# Patient Record
Sex: Female | Born: 1970 | Race: White | Hispanic: No | Marital: Married | Smoking: Never smoker
Health system: Southern US, Community
[De-identification: ages and names within clinical notes are randomized; demographics above are authoritative.]

## PROBLEM LIST (undated history)

## (undated) DIAGNOSIS — G43909 Migraine, unspecified, not intractable, without status migrainosus: Secondary | ICD-10-CM

## (undated) DIAGNOSIS — N200 Calculus of kidney: Secondary | ICD-10-CM

## (undated) DIAGNOSIS — N809 Endometriosis, unspecified: Secondary | ICD-10-CM

## (undated) HISTORY — DX: Endometriosis, unspecified: N80.9

## (undated) HISTORY — DX: Migraine, unspecified, not intractable, without status migrainosus: G43.909

## (undated) HISTORY — DX: Calculus of kidney: N20.0

## (undated) HISTORY — PX: LAPAROSCOPIC VAGINAL HYSTERECTOMY: SUR798

---

## 2016-06-22 ENCOUNTER — Ambulatory Visit (INDEPENDENT_AMBULATORY_CARE_PROVIDER_SITE_OTHER): Payer: 59 | Admitting: Neurology

## 2016-06-22 ENCOUNTER — Other Ambulatory Visit: Payer: 59

## 2016-06-22 ENCOUNTER — Encounter: Payer: Self-pay | Admitting: Neurology

## 2016-06-22 VITALS — BP 116/72 | HR 78 | Ht 65.0 in | Wt 126.0 lb

## 2016-06-22 DIAGNOSIS — G44229 Chronic tension-type headache, not intractable: Secondary | ICD-10-CM

## 2016-06-22 DIAGNOSIS — G43709 Chronic migraine without aura, not intractable, without status migrainosus: Secondary | ICD-10-CM | POA: Diagnosis not present

## 2016-06-22 DIAGNOSIS — IMO0002 Reserved for concepts with insufficient information to code with codable children: Secondary | ICD-10-CM

## 2016-06-22 LAB — COMPLETE METABOLIC PANEL WITH GFR
ALT: 20 U/L (ref 6–29)
AST: 19 U/L (ref 10–35)
Albumin: 4.6 g/dL (ref 3.6–5.1)
Alkaline Phosphatase: 50 U/L (ref 33–115)
BILIRUBIN TOTAL: 0.3 mg/dL (ref 0.2–1.2)
BUN: 15 mg/dL (ref 7–25)
CALCIUM: 9.8 mg/dL (ref 8.6–10.2)
CHLORIDE: 106 mmol/L (ref 98–110)
CO2: 27 mmol/L (ref 20–31)
Creat: 0.84 mg/dL (ref 0.50–1.10)
GFR, EST NON AFRICAN AMERICAN: 83 mL/min (ref >=60)
Glucose, Bld: 92 mg/dL (ref 65–99)
Potassium: 4.7 mmol/L (ref 3.5–5.3)
Sodium: 139 mmol/L (ref 135–146)
Total Protein: 7.3 g/dL (ref 6.1–8.1)

## 2016-06-22 MED ORDER — SUMATRIPTAN SUCCINATE 3 MG/0.5ML ~~LOC~~ SOAJ: 1.0000 | SUBCUTANEOUS | 0 refills | Status: DC

## 2016-06-22 NOTE — Addendum Note (Signed)
Addended bySilvio Pate on: 06/22/2016 04:13 PM   Modules accepted: Orders

## 2016-06-22 NOTE — Progress Notes (Signed)
NEUROLOGY CONSULTATION NOTE  Brenda Macias MRN: [id number] DOB: November 04, 1970  Referring provider: Dr. Maryjean Ka Primary care provider: No PCP  Reason for consult:  migraines  HISTORY OF PRESENT ILLNESS: Brenda Macias is a 46 year old female who presents for migraines.    Onset:  childhood Location:  Frontal or unilateral either side Quality:  stabbing Intensity:  10/10 Aura:  Flashing blue lights and floaters in vision Prodrome:  Fatigue, hunger, irritability Postdrome:  fatigue Associated symptoms:  Photophobia, phonophobia, osmophobia.  Rarely nausea.  She has not had any new worse headache of her life, waking up from sleep Duration:  1 to 3 days (no more than 2 hours with Zomig except for when she wakes up with migraine, in which they last longer) Frequency: Prior to Botox, almost daily.  Since Botox, 1 migraine day a month and total of 4 or 5 headache days a month. Triggers/exacerbating factors:  Cigarette smoke, perfumes, cheese, citrus, sleep deprivation, too much sleep, menstrual cycle, change in barometric pressure. Relieving factors:  Water, sleep, rest Activity:  Aggravates She also has bi-temporal non-throbbing tension type headaches.  They have been daily since missing her last Botox in March ([place]  Past NSAIDS:  ibuprofen, naproxen Past analgesics:  acetaminophen, Excedrin Past abortive triptans:  Zecuity patch, Maxalt (ineffective), Relpax (side effects) Past muscle relaxants:  Robaxin Past anti-emetic:  no Past antihypertensive medications:  No.  Propranolol not good option due to hypotension. Past antidepressant medications:  Cannot recall Past anticonvulsant medications:  none Other past therapies:  no  Current NSAIDS:  diclofenac ER  daily PRN Current analgesics:  Fioricet (only for severe tension headaches) Current triptans:  Zomig ZMT , Sumavel DosePro  [place] Current anti-emetic:  no Current muscle relaxants:  no Current  anti-anxiolytic:  no Current sleep aide:  no Current Antihypertensive medications:  no Current Antidepressant medications:  no Current Anticonvulsant medications:  Trokendi XR  (  causes drowsiness) Current Vitamins/Herbal/Supplements:  B12 Current Antihistamines/Decongestants:  no Other therapy:  Botox  Caffeine:  1 coke/day, green tea Alcohol:  no Smoker:  no Diet:  Hydrates.  Follows strict diet Exercise:  yes Depression/anxiety:  no Sleep hygiene:  good Family history of headache:  Mother, brother  MRI of brain without contrast from 11/02/13 was personally reviewed and demonstrated minimal and subtle foci in the left periventricular and parietal subcortical white matter.  A follow up MRI of brain with contrast was performed on 11/13/13, which revealed no abnormal enhancement.  PAST MEDICAL HISTORY: Past Medical History:  Diagnosis Date  . Endometriosis   . Kidney stones   . Migraines    pt states onset around the age of 60    PAST SURGICAL HISTORY: Past Surgical History:  Procedure Laterality Date  . LAPAROSCOPIC VAGINAL HYSTERECTOMY      MEDICATIONS: No current outpatient prescriptions on file prior to visit.   No current facility-administered medications on file prior to visit.     ALLERGIES: Allergies not on file  FAMILY HISTORY: Family History  Problem Relation Age of Onset  . Endometriosis Mother   . Hypertension Father   . Kidney cancer Father     SOCIAL HISTORY: Social History   Social History  . Marital status: Married    Spouse name: N/A  . Number of children: N/A  . Years of education: N/A   Occupational History  . Not on file.   Social History Main Topics  . Smoking status: Never Smoker  . Smokeless tobacco:  Never Used  . Alcohol use No  . Drug use: No  . Sexual activity: Yes    Birth control/ protection: Surgical   Other Topics Concern  . Not on file   Social History Narrative  . No narrative on file    REVIEW OF  SYSTEMS: Constitutional: No fevers, chills, or sweats, no generalized fatigue, change in appetite Eyes: No visual changes, double vision, eye pain Ear, nose and throat: No hearing loss, ear pain, nasal congestion, sore throat Cardiovascular: No chest pain, palpitations Respiratory:  No shortness of breath at rest or with exertion, wheezes GastrointestinaI: No nausea, vomiting, diarrhea, abdominal pain, fecal incontinence Genitourinary:  No dysuria, urinary retention or frequency Musculoskeletal:  No neck pain, back pain Integumentary: No rash, pruritus, skin lesions Neurological: as above Psychiatric: No depression, insomnia, anxiety Endocrine: No palpitations, fatigue, diaphoresis, mood swings, change in appetite, change in weight, increased thirst Hematologic/Lymphatic:  No purpura, petechiae. Allergic/Immunologic: no itchy/runny eyes, nasal congestion, recent allergic reactions, rashes  PHYSICAL EXAM: Vitals:   06/22/16 1456  BP: 116/72  Pulse: 78   General: No acute distress.  Patient appears well-groomed.  Head:  Normocephalic/atraumatic Eyes:  fundi examined but not visualized Neck: supple, no paraspinal tenderness, full range of motion Back: No paraspinal tenderness Heart: regular rate and rhythm Lungs: Clear to auscultation bilaterally. Vascular: No carotid bruits. Neurological Exam: Mental status: alert and oriented to person, place, and time, recent and remote memory intact, fund of knowledge intact, attention and concentration intact, speech fluent and not dysarthric, language intact. Cranial nerves: CN I: not tested CN II: pupils equal, round and reactive to light, visual fields intact CN III, IV, VI:  full range of motion, no nystagmus, no ptosis CN V: facial sensation intact CN VII: upper and lower face symmetric CN VIII: hearing intact CN IX, X: gag intact, uvula midline CN XI: sternocleidomastoid and trapezius muscles intact CN XII: tongue midline Bulk &  Tone: normal, no fasciculations. Motor:  5/5 throughout  Sensation: temperature and vibration sensation intact. Deep Tendon Reflexes:  2+ throughout, toes downgoing.  Finger to nose testing:  Without dysmetria.  Heel to shin:  Without dysmetria.  Gait:  Normal station and stride.  Able to turn and tandem walk. Romberg negative.  IMPRESSION: Chronic migraine/migraine with aura Chronic tension type headache  PLAN: 1.  She has responded well to Botox (well over 50% reduction in headache frequency per month after starting Botox), so we will get pre-authorization and restart it ASAP. 2.  Continue Trokendi XR  at bedtime 3.  Zomig  tablet for abortive therapy.  Will try Zembrace SymTouch injection when she wakes up with migraine. 4. Limit use of all pain relievers (diclofenac, Zomig, acetaminophen) to no more than 2 days out of the week to prevent rebound headache.  Use Fioricet sparingly. 5.  Continue exercise, diet, hydration, sleep hygiene 6.  Follow up for Botox  Thank you for allowing me to take part in the care of this patient.  Shon Millet, DO  CC:  Jossie Ng, MD

## 2016-06-22 NOTE — Progress Notes (Signed)
Note sent to Dr. Maryjean Ka.

## 2016-06-22 NOTE — Patient Instructions (Signed)
1.  We will get preauthorization and set you up for Botox ASAP 2.  Continue Trokendi XR  at bedtime 3.  Stop taking the diclofenac and acetaminophen daily.  Limit use of pain relievers to no more than 2 days out of the week to prevent rebound headache (this includes the Zomig, Fioricet, diclofenac, ibuprofen, Advil, acetaminophen).  Try to use Fiorcet sparingly. 4.  Use Zomig  tablet earliest onset of migraine.  May repeat dose once after 2 hours as needed.  If you wake up with migraine, use the sumatriptan (Zembrace SymTouch) injection (inject once and may repeat once in 1 hour if needed). 4. Be aware of common food triggers such as processed sweets, processed foods with nitrites (such as deli meat, hot dogs, sausages), foods with MSG, alcohol (such as wine), chocolate, certain cheeses, certain fruits (dried fruits, some citrus fruit), vinegar, diet soda. 5.  Avoid caffeine 6.  Routine exercise 7.  Proper sleep hygiene 8.  Stay adequately hydrated with water 9.  Keep a headache diary. 1.  Maintain proper stress management. 10.  Do not skip meals. 11.  Consider supplements:  Magnesium citrate  to  daily, riboflavin , Coenzyme Q 10  three times daily 12.  Follow up for Botox

## 2016-06-26 DIAGNOSIS — Z8669 Personal history of other diseases of the nervous system and sense organs: Secondary | ICD-10-CM | POA: Insufficient documentation

## 2016-06-26 LAB — HM MAMMOGRAPHY: HM MAMMO: NORMAL (ref 0–4)

## 2016-06-27 DIAGNOSIS — Z780 Asymptomatic menopausal state: Secondary | ICD-10-CM | POA: Insufficient documentation

## 2016-07-04 ENCOUNTER — Telehealth: Payer: Self-pay | Admitting: Neurology

## 2016-07-04 NOTE — Telephone Encounter (Signed)
Caller: PT  Urgent? No  Reason for the call: PT left a voicemail message regarding her Migraines

## 2016-07-04 NOTE — Telephone Encounter (Signed)
Spoke with patient regarding her migraines.  She says her arms and legs got numb and tingling this last migraine.  She is feeling better today.  She felt like she couldn't move and her body was heavy.  Advised her this can be a side effect of the migraine.  She just wanted to be sure there was nothing to be concerned about.  Advised patient that if this happens again and doesn't stop to go to urgent care or ER.  Patient understands.

## 2016-07-06 ENCOUNTER — Telehealth: Payer: Self-pay | Admitting: Neurology

## 2016-07-06 NOTE — Telephone Encounter (Signed)
PT called and wants to know if she can come in next week and pay for the Botox injections out of pocket while waiting for approval because the headaches are really bad/Dawn

## 2016-07-09 NOTE — Telephone Encounter (Signed)
Due to contract with insurance, we are not able to do that.

## 2016-07-11 NOTE — Telephone Encounter (Signed)
What we can do is schedule her in a follow up slot and try to get the pre-authorization approved in time for the scheduled Botox.

## 2016-07-11 NOTE — Telephone Encounter (Signed)
It is typically scheduled for next Botox clinic and we work on getting pre-authorization until then.  We can schedule her earlier for a regular follow up slot.  However, we would need to get pre-authorization first.

## 2016-07-11 NOTE — Telephone Encounter (Signed)
Patient wants to know when she can come in and get botox injection please advise patient

## 2016-07-27 ENCOUNTER — Encounter: Payer: Self-pay | Admitting: Neurology

## 2016-08-02 ENCOUNTER — Encounter: Payer: Self-pay | Admitting: Neurology

## 2016-08-13 DIAGNOSIS — Z8742 Personal history of other diseases of the female genital tract: Secondary | ICD-10-CM | POA: Insufficient documentation

## 2016-08-17 ENCOUNTER — Ambulatory Visit (INDEPENDENT_AMBULATORY_CARE_PROVIDER_SITE_OTHER): Payer: 59 | Admitting: Neurology

## 2016-08-17 DIAGNOSIS — G43709 Chronic migraine without aura, not intractable, without status migrainosus: Secondary | ICD-10-CM

## 2016-08-17 DIAGNOSIS — IMO0002 Reserved for concepts with insufficient information to code with codable children: Secondary | ICD-10-CM

## 2016-08-17 MED ORDER — ONABOTULINUMTOXINA 100 UNITS IJ SOLR
155.0000 [IU] | Freq: Once | INTRAMUSCULAR | Status: AC
  Administered 2016-08-17: 155 [IU] via INTRAMUSCULAR

## 2016-08-17 MED ORDER — ONABOTULINUMTOXINA 100 UNITS IJ SOLR: 155.0000 [IU] | Freq: Once | INTRAMUSCULAR | Status: DC

## 2016-08-17 NOTE — Progress Notes (Signed)
[facility]   Procedure Note Botox  Attending: Dr. Everlena CooperJaffe  Preoperative Diagnosis(es): Chronic migraine  Consent obtained from: The patient Benefits discussed included, but were not limited to decreased muscle tightness, increased joint range of motion, and decreased pain.  Risk discussed included, but were not limited pain and discomfort, bleeding, bruising, excessive weakness, venous thrombosis, muscle atrophy and dysphagia.  Anticipated outcomes of the procedure as well as he risks and benefits of the alternatives to the procedure, and the roles and tasks of the personnel to be involved, were discussed with the patient, and the patient consents to the procedure and agrees to proceed. A copy of the patient medication guide was given to the patient which explains the blackbox warning.  Patients identity and treatment sites confirmed Yes.  .  Details of Procedure: Skin was cleaned with alcohol. Prior to injection, the needle plunger was aspirated to make sure the needle was not within a blood vessel.  There was no blood retrieved on aspiration.    Following is a summary of the muscles injected  And the amount of Botulinum toxin used:  Dilution 200 units of Botox was reconstituted with 4 ml of preservative free normal saline. Time of reconstitution: At the time of the office visit (<30 minutes prior to injection)   Injections  155 total units of Botox was injected with a 30 gauge needle.  Injection Sites: L occipitalis: 15 units- 3 sites  R occiptalis: 15 units- 3 sites  L upper trapezius: 15 units- 3 sites R upper trapezius: 15 units- 3 sits          L paraspinal: 10 units- 2 sites R paraspinal: 10 units- 2 sites  Face L frontalis(2 injection sites):10 units   R frontalis(2 injection sites):10 units         L corrugator: 5 units   R corrugator: 5 units           Procerus: 5 units   L temporalis: 20 units R temporalis: 20 units   Agent:  200 units of botulinum Type A  (Onobotulinum Toxin type A) was reconstituted with 4 ml of preservative free normal saline.  Time of reconstitution: At the time of the office visit (<30 minutes prior to injection)     Total injected (Units): 155  Total wasted (Units): 45  Patient tolerated procedure well without complications.   Reinjection is anticipated in 3 months. Return to clinic in 4 1/2 months.  Shon MilletAdam Varnell Donate, DO

## 2016-10-22 ENCOUNTER — Encounter: Payer: Self-pay | Admitting: Physician Assistant

## 2016-10-22 ENCOUNTER — Ambulatory Visit (INDEPENDENT_AMBULATORY_CARE_PROVIDER_SITE_OTHER): Payer: 59 | Admitting: Physician Assistant

## 2016-10-22 VITALS — BP 100/72 | HR 77 | Temp 98.4°F | Resp 14 | Ht 65.5 in | Wt 127.0 lb

## 2016-10-22 DIAGNOSIS — Z8669 Personal history of other diseases of the nervous system and sense organs: Secondary | ICD-10-CM

## 2016-10-22 DIAGNOSIS — Z0000 Encounter for general adult medical examination without abnormal findings: Secondary | ICD-10-CM | POA: Insufficient documentation

## 2016-10-22 DIAGNOSIS — R5383 Other fatigue: Secondary | ICD-10-CM | POA: Diagnosis not present

## 2016-10-22 LAB — URINALYSIS, ROUTINE W REFLEX MICROSCOPIC
BILIRUBIN URINE: NEGATIVE
KETONES UR: NEGATIVE
NITRITE: NEGATIVE
RBC / HPF: NONE SEEN (ref 0–?)
Specific Gravity, Urine: <=1.005 — AB (ref 1.000–1.030)
TOTAL PROTEIN, URINE-UPE24: NEGATIVE
Urine Glucose: NEGATIVE
Urobilinogen, UA: 0.2 (ref 0.0–1.0)
pH: 6.5 (ref 5.0–8.0)

## 2016-10-22 LAB — CBC WITH DIFFERENTIAL/PLATELET
BASOS ABS: 0.1 10*3/uL (ref 0.0–0.1)
Basophils Relative: 1.1 % (ref 0.0–3.0)
EOS PCT: 3.9 % (ref 0.0–5.0)
Eosinophils Absolute: 0.2 10*3/uL (ref 0.0–0.7)
HEMATOCRIT: 38.8 % (ref 36.0–46.0)
HEMOGLOBIN: 13.5 g/dL (ref 12.0–15.0)
LYMPHS ABS: 1.2 10*3/uL (ref 0.7–4.0)
LYMPHS PCT: 24.1 % (ref 12.0–46.0)
MCHC: 34.7 g/dL (ref 30.0–36.0)
MCV: 88.2 fl (ref 78.0–100.0)
MONOS PCT: 5 % (ref 3.0–12.0)
Monocytes Absolute: 0.3 10*3/uL (ref 0.1–1.0)
NEUTROS PCT: 65.9 % (ref 43.0–77.0)
Neutro Abs: 3.4 10*3/uL (ref 1.4–7.7)
Platelets: 200 10*3/uL (ref 150.0–400.0)
RBC: 4.4 Mil/uL (ref 3.87–5.11)
RDW: 12.8 % (ref 11.5–15.5)
WBC: 5.1 10*3/uL (ref 4.0–10.5)

## 2016-10-22 LAB — LIPID PANEL
CHOLESTEROL: 109 mg/dL (ref 0–200)
HDL: 41.7 mg/dL (ref >39.00)
LDL Cholesterol: 39 mg/dL (ref 0–99)
NonHDL: 67.62
Total CHOL/HDL Ratio: 3
Triglycerides: 145 mg/dL (ref 0.0–149.0)
VLDL: 29 mg/dL (ref 0.0–40.0)

## 2016-10-22 LAB — COMPREHENSIVE METABOLIC PANEL
ALBUMIN: 4.5 g/dL (ref 3.5–5.2)
ALK PHOS: 42 U/L (ref 39–117)
ALT: 20 U/L (ref 0–35)
AST: 18 U/L (ref 0–37)
BILIRUBIN TOTAL: 0.6 mg/dL (ref 0.2–1.2)
BUN: 11 mg/dL (ref 6–23)
CO2: 27 mEq/L (ref 19–32)
Calcium: 9.3 mg/dL (ref 8.4–10.5)
Chloride: 105 mEq/L (ref 96–112)
Creatinine, Ser: 0.75 mg/dL (ref 0.40–1.20)
GFR: 87.71 mL/min (ref >60.00)
GLUCOSE: 93 mg/dL (ref 70–99)
Potassium: 4.1 mEq/L (ref 3.5–5.1)
Sodium: 139 mEq/L (ref 135–145)
TOTAL PROTEIN: 7.5 g/dL (ref 6.0–8.3)

## 2016-10-22 LAB — VITAMIN D 25 HYDROXY (VIT D DEFICIENCY, FRACTURES): VITD: 38.25 ng/mL (ref 30.00–100.00)

## 2016-10-22 LAB — HEMOGLOBIN A1C: Hgb A1c MFr Bld: 5 % (ref 4.6–6.5)

## 2016-10-22 LAB — TSH: TSH: 0.89 u[IU]/mL (ref 0.35–4.50)

## 2016-10-22 LAB — VITAMIN B12: VITAMIN B 12: 1012 pg/mL — AB (ref 211–911)

## 2016-10-22 NOTE — Progress Notes (Signed)
Patient presents to clinic today to establish care. Patient is also requesting CPE today. Is fasting for labs.   Acute Concerns: Endorses fatigue over the past few months since move from [place]. Feels this can be multifactorial. Endorses sleeping 9 hours per night but feels sleep is unrestful. Denies known tossing or turning at night. Denies snoring or witness apneic episodes. Denies change in appetite. Has kept consistent diet over the past few years but has noted 10 pound weight gain over the past few months. Denies depressed mood or anhedonia. Denies anxiety. Denies constipation. Notes dryness of skin. Father diagnosed with thyroid carcinoma at age 101.   Chronic Issues: Chronic Migraines -- Followed by Neurology -- Dr. Everlena Cooper. Is currently on a regimen of Botox injections, Trokendi XR and Zomig. Endorses migraine headache about once per week at present. Rare use of Butalbital. Has follow-up scheduled.   Health Maintenance: Immunizations -- Tetanus due. Patient declines.  Mammogram -- 2018. Dense breast tissue. Repeat yearly.  PAP -- 2017. Normal per patient. Will get records.   Past Medical History:  Diagnosis Date  . Endometriosis   . Kidney stones    Cyst and Kidney Stones  . Migraines    pt states onset around the age of 58    Past Surgical History:  Procedure Laterality Date  . LAPAROSCOPIC VAGINAL HYSTERECTOMY      Current Outpatient Prescriptions on File Prior to Visit  Medication Sig Dispense Refill  . diclofenac (VOLTAREN) 25 MG EC tablet Take 25 mg by mouth daily. Pt unsure of dosage.     Marland Kitchen NASCOBAL 500 MCG/0.1ML SOLN USE ONE SPRAY ONCE WEEKLY  11  . TROKENDI XR 25 MG CP24 Take 1 capsule by mouth daily.  3  . VIVELLE-DOT 0.1 MG/24HR patch APPLY 1 PATCH TWICE WEEKLY  0  . zolmitriptan (ZOMIG) 5 MG tablet Take 5 mg by mouth as needed for migraine.     No current facility-administered medications on file prior to visit.     Allergies  Allergen Reactions  .  Penicillins Rash    rash  . Rocephin [Ceftriaxone] Rash    Rash    Family History  Problem Relation Age of Onset  . Endometriosis Mother   . Hypertension Father   . Kidney cancer Father   . Cancer Father        Hx Thyroid  . Healthy Brother   . Healthy Brother     Social History   Social History  . Marital status: Married    Spouse name: N/A  . Number of children: N/A  . Years of education: N/A   Occupational History  . Not on file.   Social History Main Topics  . Smoking status: Never Smoker  . Smokeless tobacco: Never Used  . Alcohol use No  . Drug use: No  . Sexual activity: Yes    Birth control/ protection: Surgical     Comment: Hysterectomy   Other Topics Concern  . Not on file   Social History Narrative  . No narrative on file   Review of Systems  Constitutional: Positive for malaise/fatigue. Negative for fever and weight loss.  HENT: Negative for ear discharge, ear pain, hearing loss and tinnitus.   Eyes: Negative for blurred vision, double vision, photophobia and pain.  Respiratory: Negative for cough and shortness of breath.   Cardiovascular: Negative for chest pain and palpitations.  Gastrointestinal: Negative for abdominal pain, blood in stool, constipation, diarrhea, heartburn, melena, nausea and vomiting.  Genitourinary:  Negative for dysuria, flank pain, frequency, hematuria and urgency.  Musculoskeletal: Negative for falls.  Neurological: Positive for headaches. Negative for dizziness, tingling, tremors, sensory change, speech change, focal weakness, seizures and loss of consciousness.  Endo/Heme/Allergies: Negative for environmental allergies.  Psychiatric/Behavioral: Negative for depression, hallucinations, substance abuse and suicidal ideas. The patient is not nervous/anxious and does not have insomnia.     BP 100/72   Pulse 77   Temp 98.4 F (36.9 C) (Oral)   Resp 14   Ht 5' 5.5" (1.664 m)   Wt 127 lb (57.6 kg)   SpO2 99%   BMI 20.81  kg/m   Physical Exam  Constitutional: She is oriented to person, place, and time and well-developed, well-nourished, and in no distress.  HENT:  Head: Normocephalic and atraumatic.  Right Ear: Tympanic membrane, external ear and ear canal normal.  Left Ear: Tympanic membrane, external ear and ear canal normal.  Nose: Nose normal. No mucosal edema.  Mouth/Throat: Uvula is midline, oropharynx is clear and moist and mucous membranes are normal. No oropharyngeal exudate or posterior oropharyngeal erythema.  Eyes: Pupils are equal, round, and reactive to light. Conjunctivae are normal.  Neck: Neck supple. No thyromegaly present.  Cardiovascular: Normal rate, regular rhythm, normal heart sounds and intact distal pulses.   Pulmonary/Chest: Effort normal and breath sounds normal. No respiratory distress. She has no wheezes. She has no rales.  Abdominal: Soft. Bowel sounds are normal. She exhibits no distension and no mass. There is no tenderness. There is no rebound and no guarding.  Lymphadenopathy:    She has no cervical adenopathy.  Neurological: She is alert and oriented to person, place, and time. No cranial nerve deficit.  Skin: Skin is warm and dry. No rash noted.  Psychiatric: Affect normal.  Vitals reviewed.  Assessment/Plan: Visit for preventive health examination Depression screen negative. Health Maintenance reviewed. Will get records. Declines Tetanus today. Preventive schedule discussed and handout given in AVS. Will obtain fasting labs today.   Other fatigue Unclear. Denies depression or anxiety. Will check lab workup today to include TSH, B12 and Vitamin D.   History of migraine Followed by Neurology. Continue care as directed.     Piedad Climes, PA-C

## 2016-10-22 NOTE — Assessment & Plan Note (Signed)
Unclear. Denies depression or anxiety. Will check lab workup today to include TSH, B12 and Vitamin D.

## 2016-10-22 NOTE — Assessment & Plan Note (Signed)
Depression screen negative. Health Maintenance reviewed. Will get records. Declines Tetanus today. Preventive schedule discussed and handout given in AVS. Will obtain fasting labs today.

## 2016-10-22 NOTE — Assessment & Plan Note (Signed)
Followed by Neurology. Continue care as directed.

## 2016-10-22 NOTE — Progress Notes (Signed)
Pre visit review using our clinic review tool, if applicable. No additional management support is needed unless otherwise documented below in the visit note. 

## 2016-10-22 NOTE — Patient Instructions (Signed)
Please go to the lab for blood work.   Our office will call you with your results unless you have chosen to receive results via MyChart.  If your blood work is normal we will follow-up each year for physicals and as scheduled for chronic medical problems.  If anything is abnormal we will treat accordingly and get you in for a follow-up.  Please follow-up with specialists as scheduled.   Preventive Care 40-64 Years, Female Preventive care refers to lifestyle choices and visits with your health care provider that can promote health and wellness. What does preventive care include?  A yearly physical exam. This is also called an annual well check.  Dental exams once or twice a year.  Routine eye exams. Ask your health care provider how often you should have your eyes checked.  Personal lifestyle choices, including: ? Daily care of your teeth and gums. ? Regular physical activity. ? Eating a healthy diet. ? Avoiding tobacco and drug use. ? Limiting alcohol use. ? Practicing safe sex. ? Taking low-dose aspirin daily starting at age 73. ? Taking vitamin and mineral supplements as recommended by your health care provider. What happens during an annual well check? The services and screenings done by your health care provider during your annual well check will depend on your age, overall health, lifestyle risk factors, and family history of disease. Counseling Your health care provider may ask you questions about your:  Alcohol use.  Tobacco use.  Drug use.  Emotional well-being.  Home and relationship well-being.  Sexual activity.  Eating habits.  Work and work Statistician.  Method of birth control.  Menstrual cycle.  Pregnancy history.  Screening You may have the following tests or measurements:  Height, weight, and BMI.  Blood pressure.  Lipid and cholesterol levels. These may be checked every 5 years, or more frequently if you are over 63 years old.  Skin  check.  Lung cancer screening. You may have this screening every year starting at age 22 if you have a 30-pack-year history of smoking and currently smoke or have quit within the past 15 years.  Fecal occult blood test (FOBT) of the stool. You may have this test every year starting at age 67.  Flexible sigmoidoscopy or colonoscopy. You may have a sigmoidoscopy every 5 years or a colonoscopy every 10 years starting at age 20.  Hepatitis C blood test.  Hepatitis B blood test.  Sexually transmitted disease (STD) testing.  Diabetes screening. This is done by checking your blood sugar (glucose) after you have not eaten for a while (fasting). You may have this done every 1-3 years.  Mammogram. This may be done every 1-2 years. Talk to your health care provider about when you should start having regular mammograms. This may depend on whether you have a family history of breast cancer.  BRCA-related cancer screening. This may be done if you have a family history of breast, ovarian, tubal, or peritoneal cancers.  Pelvic exam and Pap test. This may be done every 3 years starting at age 52. Starting at age 78, this may be done every 5 years if you have a Pap test in combination with an HPV test.  Bone density scan. This is done to screen for osteoporosis. You may have this scan if you are at high risk for osteoporosis.  Discuss your test results, treatment options, and if necessary, the need for more tests with your health care provider. Vaccines Your health care provider may recommend certain  vaccines, such as:  Influenza vaccine. This is recommended every year.  Tetanus, diphtheria, and acellular pertussis (Tdap, Td) vaccine. You may need a Td booster every 10 years.  Varicella vaccine. You may need this if you have not been vaccinated.  Zoster vaccine. You may need this after age 57.  Measles, mumps, and rubella (MMR) vaccine. You may need at least one dose of MMR if you were born in 1957  or later. You may also need a second dose.  Pneumococcal 13-valent conjugate (PCV13) vaccine. You may need this if you have certain conditions and were not previously vaccinated.  Pneumococcal polysaccharide (PPSV23) vaccine. You may need one or two doses if you smoke cigarettes or if you have certain conditions.  Meningococcal vaccine. You may need this if you have certain conditions.  Hepatitis A vaccine. You may need this if you have certain conditions or if you travel or work in places where you may be exposed to hepatitis A.  Hepatitis B vaccine. You may need this if you have certain conditions or if you travel or work in places where you may be exposed to hepatitis B.  Haemophilus influenzae type b (Hib) vaccine. You may need this if you have certain conditions.  Talk to your health care provider about which screenings and vaccines you need and how often you need them. This information is not intended to replace advice given to you by your health care provider. Make sure you discuss any questions you have with your health care provider. Document Released: 03/18/2015 Document Revised: 11/09/2015 Document Reviewed: 12/21/2014 Elsevier Interactive Patient Education  2017 [employer].

## 2016-10-24 ENCOUNTER — Other Ambulatory Visit: Payer: Self-pay | Admitting: Physician Assistant

## 2016-10-24 ENCOUNTER — Encounter: Payer: Self-pay | Admitting: Emergency Medicine

## 2016-10-24 MED ORDER — TINIDAZOLE 500 MG PO TABS: 1.0000 g | ORAL_TABLET | Freq: Every day | ORAL | 0 refills | Status: DC

## 2016-11-23 ENCOUNTER — Ambulatory Visit (INDEPENDENT_AMBULATORY_CARE_PROVIDER_SITE_OTHER): Payer: 59 | Admitting: Neurology

## 2016-11-23 DIAGNOSIS — G43709 Chronic migraine without aura, not intractable, without status migrainosus: Secondary | ICD-10-CM

## 2016-11-23 DIAGNOSIS — IMO0002 Reserved for concepts with insufficient information to code with codable children: Secondary | ICD-10-CM

## 2016-11-23 MED ORDER — ONABOTULINUMTOXINA 100 UNITS IJ SOLR
200.0000 [IU] | Freq: Once | INTRAMUSCULAR | Status: AC
  Administered 2016-11-23: 200 [IU] via INTRAMUSCULAR

## 2016-11-23 MED ORDER — ONABOTULINUMTOXINA 100 UNITS IJ SOLR: 155.0000 [IU] | Freq: Once | INTRAMUSCULAR | Status: DC

## 2016-11-23 NOTE — Progress Notes (Signed)
[facility]   Procedure Note Botox  Attending: Dr. Jaffe  Preoperative Diagnosis(es): Chronic migraine  Consent obtained from: The patient Benefits discussed included, but were not limited to decreased muscle tightness, increased joint range of motion, and decreased pain.  Risk discussed included, but were not limited pain and discomfort, bleeding, bruising, excessive weakness, venous thrombosis, muscle atrophy and dysphagia.  Anticipated outcomes of the procedure as well as he risks and benefits of the alternatives to the procedure, and the roles and tasks of the personnel to be involved, were discussed with the patient, and the patient consents to the procedure and agrees to proceed. A copy of the patient medication guide was given to the patient which explains the blackbox warning.  Patients identity and treatment sites confirmed Yes.  .  Details of Procedure: Skin was cleaned with alcohol. Prior to injection, the needle plunger was aspirated to make sure the needle was not within a blood vessel.  There was no blood retrieved on aspiration.    Following is a summary of the muscles injected  And the amount of Botulinum toxin used:  Dilution 200 units of Botox was reconstituted with 4 ml of preservative free normal saline. Time of reconstitution: At the time of the office visit (<30 minutes prior to injection)   Injections  155 total units of Botox was injected with a 30 gauge needle.  Injection Sites: L occipitalis: 15 units- 3 sites  R occiptalis: 15 units- 3 sites  L upper trapezius: 15 units- 3 sites R upper trapezius: 15 units- 3 sits          L paraspinal: 10 units- 2 sites R paraspinal: 10 units- 2 sites  Face L frontalis(2 injection sites):10 units   R frontalis(2 injection sites):10 units         L corrugator: 5 units   R corrugator: 5 units           Procerus: 5 units   L temporalis: 20 units R temporalis: 20 units   Agent:  200 units of botulinum Type A  (Onobotulinum Toxin type A) was reconstituted with 4 ml of preservative free normal saline.  Time of reconstitution: At the time of the office visit (<30 minutes prior to injection)     Total injected (Units): 155  Total wasted (Units): 45  Patient tolerated procedure well without complications.   Reinjection is anticipated in 3 months. Return to clinic next month.  Adam R. Jaffe, DO  

## 2016-12-24 ENCOUNTER — Encounter: Payer: Self-pay | Admitting: Neurology

## 2016-12-24 ENCOUNTER — Ambulatory Visit (INDEPENDENT_AMBULATORY_CARE_PROVIDER_SITE_OTHER): Payer: 59 | Admitting: Neurology

## 2016-12-24 VITALS — BP 118/76 | HR 78 | Ht 65.5 in | Wt 128.6 lb

## 2016-12-24 DIAGNOSIS — G43709 Chronic migraine without aura, not intractable, without status migrainosus: Secondary | ICD-10-CM | POA: Diagnosis not present

## 2016-12-24 DIAGNOSIS — IMO0002 Reserved for concepts with insufficient information to code with codable children: Secondary | ICD-10-CM

## 2016-12-24 DIAGNOSIS — G44229 Chronic tension-type headache, not intractable: Secondary | ICD-10-CM | POA: Diagnosis not present

## 2016-12-24 NOTE — Progress Notes (Signed)
NEUROLOGY FOLLOW UP OFFICE NOTE  Yahaira Bruski [id number]  HISTORY OF PRESENT ILLNESS: Brenda Macias is a 46 year old female who follows up for migraines.    UPDATE: She averages one severe migraine a month, aborted within 2 hours with Zomig.  She has moderate headaches about 4 to 5 days per week.  The change in weather seems to be a trigger. Current NSAIDS:  diclofenac ER 100mg  daily PRN Current analgesics:  Fioricet (only for severe tension headaches) Current triptans:  Zomig ZMT 5mg , Zembrace SymTouch (has not used) Current anti-emetic:  no Current muscle relaxants:  no Current anti-anxiolytic:  no Current sleep aide:  no Current Antihypertensive medications:  no Current Antidepressant medications:  no Current Anticonvulsant medications:  Trokendi XR 25mg  (50mg  causes drowsiness) Current Vitamins/Herbal/Supplements:  B12 Current Antihistamines/Decongestants:  no Other therapy:  Botox   Caffeine:  1 coke/day, green tea Alcohol:  no Smoker:  no Diet:  Hydrates.  Follows strict diet Exercise:  yes Depression/anxiety:  no Sleep hygiene:  good  HISTORY: Onset:  childhood Location:  Frontal or unilateral either side Quality:  stabbing Initial Intensity:  10/10 Aura:  Flashing blue lights and floaters in vision Prodrome:  Fatigue, hunger, irritability Postdrome:  fatigue Associated symptoms:  Photophobia, phonophobia, osmophobia.  Rarely nausea.  She has not had any new worse headache of her life, waking up from sleep Initial Duration:  1 to 3 days (no more than 2 hours with Zomig except for when she wakes up with migraine, in which they last longer) Initial Frequency: Prior to Botox, almost daily.  Since Botox, 1 migraine day a month and total of 4 or 5 headache days a month. Triggers/exacerbating factors:  Cigarette smoke, perfumes, cheese, citrus, sleep deprivation, too much sleep, menstrual cycle, change in barometric pressure. Relieving factors:  Water, sleep,  rest Activity:  Aggravates She also has bi-temporal non-throbbing tension type headaches.  They have been daily since missing her last Botox in March ([place]   Past NSAIDS:  ibuprofen, naproxen Past analgesics:  acetaminophen, Excedrin Past abortive triptans:  Zecuity patch, Maxalt (ineffective), Relpax (side effects) Past muscle relaxants:  Robaxin Past anti-emetic:  no Past antihypertensive medications:  No.  Propranolol not good option due to hypotension. Past antidepressant medications:  Cannot recall Past anticonvulsant medications:  none Other past therapies:  no   Family history of headache:  Mother, brother   MRI of brain without contrast from 11/02/13 was personally reviewed and demonstrated minimal and subtle foci in the left periventricular and parietal subcortical white matter.  A follow up MRI of brain with contrast was performed on 11/13/13, which revealed no abnormal enhancement.  PAST MEDICAL HISTORY: Past Medical History:  Diagnosis Date  . Endometriosis   . Kidney stones    Cyst and Kidney Stones  . Migraines    pt states onset around the age of 40    MEDICATIONS: Current Outpatient Prescriptions on File Prior to Visit  Medication Sig Dispense Refill  . diclofenac (VOLTAREN) 25 MG EC tablet Take 25 mg by mouth daily. Pt unsure of dosage.     Marland Kitchen NASCOBAL 500 MCG/0.1ML SOLN USE ONE SPRAY ONCE WEEKLY  11  . tinidazole (TINDAMAX) 500 MG tablet Take 2 tablets (1,000 mg total) by mouth daily with breakfast. For 5 Days. 10 tablet 0  . TROKENDI XR 25 MG CP24 Take 1 capsule by mouth daily.  3  . VIVELLE-DOT 0.1 MG/24HR patch APPLY 1 PATCH TWICE WEEKLY  0  .  zolmitriptan (ZOMIG) 5 MG tablet Take 5 mg by mouth as needed for migraine.     No current facility-administered medications on file prior to visit.     ALLERGIES: Allergies  Allergen Reactions  . Penicillins Rash    rash  . Rocephin [Ceftriaxone] Rash    Rash    FAMILY  HISTORY: Family History  Problem Relation Age of Onset  . Endometriosis Mother   . Hypertension Father   . Kidney cancer Father   . Cancer Father        Hx Thyroid  . Healthy Brother   . Healthy Brother     SOCIAL HISTORY: Social History   Social History  . Marital status: Married    Spouse name: N/A  . Number of children: N/A  . Years of education: N/A   Occupational History  . Not on file.   Social History Main Topics  . Smoking status: Never Smoker  . Smokeless tobacco: Never Used  . Alcohol use No  . Drug use: No  . Sexual activity: Yes    Birth control/ protection: Surgical     Comment: Hysterectomy   Other Topics Concern  . Not on file   Social History Narrative  . No narrative on file    REVIEW OF SYSTEMS: Constitutional: No fevers, chills, or sweats, no generalized fatigue, change in appetite Eyes: No visual changes, double vision, eye pain Ear, nose and throat: No hearing loss, ear pain, nasal congestion, sore throat Cardiovascular: No chest pain, palpitations Respiratory:  No shortness of breath at rest or with exertion, wheezes GastrointestinaI: No nausea, vomiting, diarrhea, abdominal pain, fecal incontinence Genitourinary:  No dysuria, urinary retention or frequency Musculoskeletal:  No neck pain, back pain Integumentary: No rash, pruritus, skin lesions Neurological: as above Psychiatric: No depression, insomnia, anxiety Endocrine: No palpitations, fatigue, diaphoresis, mood swings, change in appetite, change in weight, increased thirst Hematologic/Lymphatic:  No purpura, petechiae. Allergic/Immunologic: no itchy/runny eyes, nasal congestion, recent allergic reactions, rashes  PHYSICAL EXAM: Vitals:   12/24/16 1532  BP: 118/76  Pulse: 78  SpO2: 99%   General: No acute distress.  Patient appears well-groomed.  normal body habitus. Head:  Normocephalic/atraumatic Eyes:  Fundi examined but not visualized Neck: supple, no paraspinal  tenderness, full range of motion Heart:  Regular rate and rhythm Lungs:  Clear to auscultation bilaterally Back: No paraspinal tenderness Neurological Exam: alert and oriented to person, place, and time. Attention span and concentration intact, recent and remote memory intact, fund of knowledge intact.  Speech fluent and not dysarthric, language intact.  CN II-XII intact. Bulk and tone normal, muscle strength 5/5 throughout.  Sensation to light touch  intact.  Deep tendon reflexes 2+ throughout.  Finger to nose testing intact.  Gait normal  IMPRESSION: Migraine, improved on Botox (50% decreased in frequency and intensity) Chronic tension type headache  PLAN: 1.  Continue Botox and Trokendi XR 25mg  2.  Zomig as needed.  She has Zembrace SymTouch on-hand in case she wakes up with a migraine 3.  Follow up for Botox in December.  Shon MilletAdam Jaffe, DO  CC: Waldon MerlWilliam C Martin, PA-C

## 2016-12-31 ENCOUNTER — Ambulatory Visit: Payer: 59 | Admitting: Neurology

## 2017-01-29 ENCOUNTER — Telehealth: Payer: Self-pay | Admitting: Neurology

## 2017-01-29 NOTE — Telephone Encounter (Signed)
Patient called regarding her Left shoulder and left side of her head hurting bad since her last Botox Injection. She though it would eventually ware off and feel better but it has not. Please Call. Thanks

## 2017-01-29 NOTE — Telephone Encounter (Signed)
Called and spoke with Pt. She said normally after her Botox injections, shoulders are sore for a couple of weeks, but it usually gets better. This time it has not. Turning her head to the left is painful, not able to rotate more than half of what she normally can. Unable to find a comfortable position when sleeping. Headaches are worse when she wakes, lessen as the day progresses. Headaches have not gotten better after Botox like usual, if anything they are worse. She is scheduled for Botox 02/22/17.

## 2017-01-30 NOTE — Telephone Encounter (Signed)
If symptoms have not resolved, then I would not continue Botox next month.  Instead, she may follow up in office to discuss other options.

## 2017-01-30 NOTE — Telephone Encounter (Signed)
Called to advise, she will discuss options at next visit. If her symptoms have not improved in the next week or so, she will call back.

## 2017-01-31 ENCOUNTER — Encounter: Payer: Self-pay | Admitting: Neurology

## 2017-02-20 ENCOUNTER — Telehealth: Payer: Self-pay

## 2017-02-20 NOTE — Telephone Encounter (Signed)
Called [employer] to verify Botox approval. It was noted on the appt, but could not find any documentation in the chart. Approval # [id number] valid 07/13/16-07/12/17. Spoke with Viviann SpareSteven, Botox to arrive tomorrow afternoon. [employer] home delivery [phone]

## 2017-02-21 ENCOUNTER — Telehealth: Payer: Self-pay | Admitting: Neurology

## 2017-02-21 NOTE — Telephone Encounter (Signed)
[employer] called to let Dr Everlena CooperJaffe know th e Botox will be delivered  tomorrow

## 2017-02-22 ENCOUNTER — Ambulatory Visit (INDEPENDENT_AMBULATORY_CARE_PROVIDER_SITE_OTHER): Payer: 59 | Admitting: Neurology

## 2017-02-22 DIAGNOSIS — M62838 Other muscle spasm: Secondary | ICD-10-CM

## 2017-02-22 DIAGNOSIS — G43709 Chronic migraine without aura, not intractable, without status migrainosus: Secondary | ICD-10-CM

## 2017-02-22 DIAGNOSIS — IMO0002 Reserved for concepts with insufficient information to code with codable children: Secondary | ICD-10-CM

## 2017-02-22 MED ORDER — ONABOTULINUMTOXINA 100 UNITS IJ SOLR
200.0000 [IU] | Freq: Once | INTRAMUSCULAR | Status: AC
  Administered 2017-02-22: 200 [IU] via INTRAMUSCULAR

## 2017-02-22 NOTE — Progress Notes (Signed)
Following the last Botox, she developed neck and shoulder pain, which is not unusual.  The pain usually subsides after 2 weeks.  However, this time it has persisted.  It involves the left posterior side of her neck, radiating down to the shoulder.  When she turns her head to the left, she feels the pain radiate up to the head.  The trigger appears to be at the upper paraspinal thoracic region.  I recommended holding off on Botox but she insisted, so I avoided injecting the left trapezius.  On limited exam, she exhibits swelling and tenderness along the left side of her neck, from the suboccipital region down the paraspinals and into the trapezius.  At this point, I don't think the neck pain is a direct result of the Botox.  I will refer her for OMM for treatment of cervicalgia.  She will see me for office follow-up in 6 to 8 weeks.

## 2017-02-22 NOTE — Progress Notes (Signed)
[facility]   Procedure Note Botox  Attending: Dr. Shon MilletAdam Stevi Hollinshead  Preoperative Diagnosis(es): Chronic migraine  Consent obtained from: The patient Benefits discussed included, but were not limited to decreased muscle tightness, increased joint range of motion, and decreased pain.  Risk discussed included, but were not limited pain and discomfort, bleeding, bruising, excessive weakness, venous thrombosis, muscle atrophy and dysphagia.  Anticipated outcomes of the procedure as well as he risks and benefits of the alternatives to the procedure, and the roles and tasks of the personnel to be involved, were discussed with the patient, and the patient consents to the procedure and agrees to proceed. A copy of the patient medication guide was given to the patient which explains the blackbox warning.  Patients identity and treatment sites confirmed Yes.  .  Details of Procedure: Skin was cleaned with alcohol. Prior to injection, the needle plunger was aspirated to make sure the needle was not within a blood vessel.  There was no blood retrieved on aspiration.    Following is a summary of the muscles injected  And the amount of Botulinum toxin used:  Dilution 200 units of Botox was reconstituted with 4 ml of preservative free normal saline. Time of reconstitution: At the time of the office visit (<30 minutes prior to injection)   Injections  155 total units of Botox was injected with a 30 gauge needle.  Injection Sites: L occipitalis: 15 units- 3 sites  R occiptalis: 15 units- 3 sites  L upper trapezius: 0 units- 0 sites R upper trapezius: 15 units- 3 sits          L paraspinal: 10 units- 2 sites R paraspinal: 10 units- 2 sites  Face L frontalis(2 injection sites):10 units   R frontalis(2 injection sites):10 units         L corrugator: 5 units   R corrugator: 5 units           Procerus: 5 units   L temporalis: 20 units R temporalis: 20 units   Agent:  200 units of botulinum Type  A (Onobotulinum Toxin type A) was reconstituted with 4 ml of preservative free normal saline.  Time of reconstitution: At the time of the office visit (<30 minutes prior to injection)     Total injected (Units): 140  Total wasted (Units): 60  Patient tolerated procedure well without complications.   Reinjection is anticipated in 3 months. Return to clinic in 6-8 weeks.

## 2017-02-28 ENCOUNTER — Encounter: Payer: Self-pay | Admitting: Sports Medicine

## 2017-02-28 ENCOUNTER — Ambulatory Visit (INDEPENDENT_AMBULATORY_CARE_PROVIDER_SITE_OTHER): Payer: 59 | Admitting: Sports Medicine

## 2017-02-28 ENCOUNTER — Ambulatory Visit (INDEPENDENT_AMBULATORY_CARE_PROVIDER_SITE_OTHER): Payer: 59

## 2017-02-28 VITALS — BP 110/80 | HR 84 | Ht 65.5 in | Wt 128.0 lb

## 2017-02-28 DIAGNOSIS — M9900 Segmental and somatic dysfunction of head region: Secondary | ICD-10-CM | POA: Diagnosis not present

## 2017-02-28 DIAGNOSIS — M9902 Segmental and somatic dysfunction of thoracic region: Secondary | ICD-10-CM

## 2017-02-28 DIAGNOSIS — M542 Cervicalgia: Secondary | ICD-10-CM

## 2017-02-28 DIAGNOSIS — Z8669 Personal history of other diseases of the nervous system and sense organs: Secondary | ICD-10-CM | POA: Diagnosis not present

## 2017-02-28 DIAGNOSIS — M9901 Segmental and somatic dysfunction of cervical region: Secondary | ICD-10-CM

## 2017-02-28 MED ORDER — GABAPENTIN 300 MG PO CAPS: ORAL_CAPSULE | ORAL | 1 refills | Status: DC

## 2017-02-28 NOTE — Patient Instructions (Signed)
Please perform the exercise program that we have prepared for you and gone over in detail on a daily basis.  In addition to the handout you were provided you can access your program through: [email or web address]   Your unique program code is: [email or web address]

## 2017-02-28 NOTE — Progress Notes (Signed)
[name] at [facility] [phone]  Brenda Macias - 46 y.o. female MRN [id number]  Date of birth: [birth year]  Visit Date: 02/28/2017  PCP: Waldon Merl, PA-C   Referred by: Waldon Merl, PA-C   Scribe for today's visit: Stevenson Clinch, CMA    SUBJECTIVE:  Brenda Macias is here for New Patient (Initial Visit) (LT-sided neck pain and LT shoulder pain) .  Referred by: Dr. Everlena Cooper Her LT-sided neck and shoulder pain symptoms INITIALLY: Began about 1-2 months ago and the is no known MOI.   Neck pain: Described as mild stabbing pain, radiating to the upper back, shoulder, and neck.  Worsened with turning head to the LT or RT, LT > RT. Pain is worse when lying down at night.  Improved with rest. She has been seen by a chiropractor in the past, years ago, and got some relief of pain associated with migraine HA.  Additional associated symptoms include: LT shoulder pain At this time symptoms are worsening compared to onset, severity is getting worse over time.    LT shoulder: Described as moderate-severe aching at rest but shooting stabbing pain with movement.  Worsened with movement.  Improved with rest.  Additional associated symptoms include: LT-sided neck pain.  At this time symptoms show no change compared to onset; she gets Botox injections for her migraines and normally when the injection starts to ware off the pain goes away but her neurologist didn't think that was the reason for her pain.   She has icing ice, mineral ice gel, therma-wrap, and a muscle relaxer with minimal relief. The muscle relaxer did help her get to sleep.    ROS Reports night time disturbances. Denies fevers, chills, or night sweats. Denies unexplained weight loss. Denies personal history of cancer. Denies changes in bowel or bladder habits. Denies recent unreported falls. Denies new or worsening dyspnea or wheezing. Reports  headaches or dizziness.  Reports numbness, tingling or weakness in the LT arm.  Denies dizziness or presyncopal episodes Denies lower extremity edema     HISTORY & PERTINENT PRIOR DATA:  Prior History reviewed and updated per electronic medical record.  Significant history, findings, studies and interim changes include:  reports that  has never smoked. she has never used smokeless tobacco. Recent Labs    10/22/16 1110  HGBA1C 5.0   No specialty comments available. Problem  Neck Pain On Left Side     OBJECTIVE:  VS:  HT:5' 5.5" (166.4 cm)   WT:128 lb (58.1 kg)  BMI:20.97    BP:110/80  HR:84bpm  TEMP: ( )  RESP:99 %   PHYSICAL EXAM: Constitutional: WDWN, Non-toxic appearing. Psychiatric: Alert & appropriately interactive. Not depressed or anxious appearing. Respiratory: No increased work of breathing. Trachea Midline Eyes: Pupils are equal. EOM intact without nystagmus. No scleral icterus Cardiovascular:  Peripheral Pulses: peripheral pulses symmetrical No clubbing or cyanosis appreciated Capillary Refill is normal, less than 2 seconds No signficant generalized edema/anasarca Sensory Exam: intact to light touch  Neck:   Well aligned, no significant torticollis  Midline Bony TTP: none, mild   Paraspinal Muscle Spasm: Yes, bilateral  CERVICAL ROM: normal range of motion  NEURAL TENSION SIGNS Right Left  Brachial Plexus Squeeze: positive, mild pain positive, mild pain  Arm Squeeze Test: normal, no pain normal, no pain  Spurling's Compression Test: normal, no pain normal, no pain  Lhermitte's Compression test: Negative, no radiating pain   REFLEXES Right Left  DTR - C5 -Biceps  2+ 2+  DTR - C6 - Brachiorad 2+ 2+  DTR - C7 - Triceps 2+ 2+   UMN - Hoffman's Negative/Normal Negative/Normal  UMN - Pectoral     MOTOR TESTING: Intact in all UE myotomes   Findings:  PROCEDURE NOTE : OSTEOPATHIC MANIPULATION The decision today to treat with Osteopathic  Manipulative Therapy (OMT) was based on physical exam findings. Verbal consent was obtained after after explanation of risks, benefits and potential side effects, including acute pain flare, post manipulation soreness and need for repeat treatments.   Additional time was spent discussing the minimal risk of  injury to neurovascular structures for associated Cervical manipulation.  After verbal consent was obtained manipulation was performed as below:  Contraindications to OMT reviewed and include: NONE.             Regions treated: Per examined regions as below and associated billing codes          Techniques used: Muscle Energy, MFR, HVLA, OCT, CS  and ART The patient tolerated the treatment well and reported Improved symptoms following treatment today. Patient was given medications, exercises, stretches and lifestyle modifications per AVS and verbally.     OSTEOPATHIC/STRUCTURAL EXAM FINDINGS:   SBS compression C2 through C4 FRS right C6 extended rotated left T2 extended rotated left T4 through T6 FRS right   ++++++++++++++++++++++++++++++++++++++++++++ PROCEDURE NOTE: THERAPEUTIC EXERCISES (97110) 15 minutes spent for Therapeutic exercises as below and as referenced in the AVS. This included exercises focusing on stretching, strengthening, with significant focus on eccentric aspects.  Proper technique shown and discussed handout in great detail with ATC. All questions were discussed and answered.   Long term goals include an improvement in range of motion, strength, endurance as well as avoiding reinjury. Frequency of visits is one time as determined during today's  office visit. Frequency of exercises to be performed is as per handout.  EXERCISES REVIEWED: Towel exercises home cervical traction Posterior chain strengthening and recruitment exercises     ASSESSMENT & PLAN:   1. Neck pain on left side   2. History of migraine   3. Somatic dysfunction of head region   4. Somatic  dysfunction of cervical region   5. Somatic dysfunction of thoracic region    PLAN:    Neck pain on left side Fairly significant functional disturbances of her cervical spine and significant osteopathic findings cranial dysfunction.  Osteopathic manipulation performed today per procedure note.  She tolerated this quite well.  We will plan to follow-up with her in 1 week to check on her progress and consider advancing her therapeutic exercises and likely repeating osteopathic manipulation.  We did discuss having her try gabapentin as well for myofascial component and she is amenable to trying this.  We will plan to follow-up with her once again as above    ++++++++++++++++++++++++++++++++++++++++++++ Orders & Meds: Orders Placed This Encounter  Procedures  . DG Cervical Spine Complete    Meds ordered this encounter  Medications  . gabapentin (NEURONTIN) 300 MG capsule    Sig: Start with 1 tab po qhs X 1 week, then increase to 1 tab po bid X 1 week then 1 tab po tid prn    Dispense:  90 capsule    Refill:  1    ++++++++++++++++++++++++++++++++++++++++++++ Follow-up: Return in about 1 week (around 03/07/2017).   Pertinent documentation may be included in additional procedure notes, imaging studies, problem based documentation and patient instructions. Please see these sections  of the encounter for additional information regarding this visit. CMA/ATC served as Neurosurgeonscribe during this visit. History, Physical, and Plan performed by medical provider. Documentation and orders reviewed and attested to.      Andrena MewsMichael D Bo Teicher, DO    [place] [facility] Phys[facility]

## 2017-03-07 ENCOUNTER — Ambulatory Visit (INDEPENDENT_AMBULATORY_CARE_PROVIDER_SITE_OTHER): Payer: 59 | Admitting: Sports Medicine

## 2017-03-07 ENCOUNTER — Encounter: Payer: Self-pay | Admitting: Sports Medicine

## 2017-03-07 VITALS — BP 110/80 | HR 90 | Ht 65.5 in | Wt 129.6 lb

## 2017-03-07 DIAGNOSIS — M9901 Segmental and somatic dysfunction of cervical region: Secondary | ICD-10-CM | POA: Diagnosis not present

## 2017-03-07 DIAGNOSIS — M9902 Segmental and somatic dysfunction of thoracic region: Secondary | ICD-10-CM | POA: Diagnosis not present

## 2017-03-07 DIAGNOSIS — M9900 Segmental and somatic dysfunction of head region: Secondary | ICD-10-CM | POA: Diagnosis not present

## 2017-03-07 DIAGNOSIS — Z8669 Personal history of other diseases of the nervous system and sense organs: Secondary | ICD-10-CM

## 2017-03-07 DIAGNOSIS — M542 Cervicalgia: Secondary | ICD-10-CM | POA: Insufficient documentation

## 2017-03-07 MED ORDER — GABAPENTIN 100 MG PO CAPS: ORAL_CAPSULE | ORAL | 1 refills | Status: DC

## 2017-03-07 NOTE — Progress Notes (Signed)
Brenda Macias. Brenda [facility]ias [facility] at [facility] [phone]  Brenda Macias - 47 y.o. female MRN [id number]  Date of birth: [birth year]  Visit Date: 03/07/2017  PCP: Waldon Merl, PA-C   Referred by: Waldon Merl, PA-C   Scribe for today's visit: Stevenson Clinch, CMA     SUBJECTIVE:  Brenda Macias "[name]" is here for Follow-up (LT-sided neck pain)  Her LT-sided neck and shoulder pain symptoms INITIALLY: Began about 2 months ago and the is no known MOI.  Neck pain: Described as mild stabbing pain, radiating to the upper back, shoulder, and neck.  Worsened with turning head to the LT or RT, LT > RT. Pain is worse when lying down at night.  Improved with rest. She has been seen by a chiropractor in the past, years ago, and got some relief of pain associated with migraine HA.  Additional associated symptoms include: LT shoulder pain At this time symptoms are worsening compared to onset, severity is getting worse over time.  LT shoulder: Described as moderate-severe aching at rest but shooting stabbing pain with movement.  Worsened with movement.  Improved with rest.  Additional associated symptoms include: LT-sided neck pain.       Compared to the last office visit, her previously described symptoms are improving.  Current symptoms are mild & are radiating to LT shoulder She has tried using ice, mineral ice gel, therma-wrap, and a muscle relaxer in the past with minimal relief. The muscle relaxer did help her get to sleep. She was prescribed Gabapentin at her last visit and has not been tolerating this medication well, she has noticed increased drowsiness and fogginess. She only took this medication at bedtime.    ROS Denies night time disturbances. Denies fevers, chills, or night sweats. Denies unexplained weight loss. Denies personal history of cancer. Denies changes in bowel or bladder habits. Denies recent unreported  falls. Denies new or worsening dyspnea or wheezing. Reports headaches or dizziness.  Reports numbness, tingling or weakness  In the extremities.  Denies dizziness or presyncopal episodes Denies lower extremity edema     HISTORY & PERTINENT PRIOR DATA:  Prior History reviewed and updated per electronic medical record.  Significant history, findings, studies and interim changes include:  reports that  has never smoked. she has never used smokeless tobacco. Recent Labs    10/22/16 1110  HGBA1C 5.0   No specialty comments available. No problems updated.  OBJECTIVE:  VS:  HT:5' 5.5" (166.4 cm)   WT:129 lb 9.6 oz (58.8 kg)  BMI:21.23    BP:110/80  HR:90bpm  TEMP: ( )  RESP:98 %   PHYSICAL EXAM: Constitutional: WDWN, Non-toxic appearing. Psychiatric: Alert & appropriately interactive.  Not depressed or anxious appearing. Respiratory: No increased work of breathing.  Trachea Midline Eyes: Pupils are equal.  EOM intact without nystagmus.  No scleral icterus  EXTREMITIES EXAM: No clubbing or cyanosis appreciated Capillary Refill is normal, less than 2 seconds No significant venous stasis changes Pre-tibial edema: No significant pretibial edema Pedal Pulses: Normal & symmetrically palpable  Sensation: Intact to light touch in all UE dermatomes Strength: Normal in bilateral UE myotomes Reflexes: Normal & symmetric DTRs in bilateral UE myotomes  Cervical range of motion overall improved.  Good flexion and extension.  Negative Spurling's compression test and upper extremity neural tension signs are normal.  Markedly tight cervicothoracic junction and rhomboids.  No additional findings.   ASSESSMENT & PLAN:   1. History of  migraine   2. Somatic dysfunction of cervical region   3. Somatic dysfunction of thoracic region   4. Somatic dysfunction of head region    PLAN: She previously did well with osteopathic manipulation this was repeated today.  Additionally we discussed  titration of gabapentin for sleep component possible myofascial component as well as migraine prophylaxis.  She had an exaggerated response to 300 mg tablets we will let her try a slightly lower dose with 100 mg and if any persistent symptoms can discontinue use. We will plan to have her follow-up in 2 weeks for repeat evaluation and manipulation and discussion of further titration of medications at that time.  ++++++++++++++++++++++++++++++++++++++++++++ Orders & Meds: Orders Placed This Encounter  Procedures  . OSTEOPATHIC MANIPULATION TREATMENT    Meds ordered this encounter  Medications  . gabapentin (NEURONTIN) 100 MG capsule    Sig: Start with 1 tab po qhs X 1 week, then increase to 1 tab po bid X 1 week then 1 tab po tid prn    Dispense:  90 capsule    Refill:  1    ++++++++++++++++++++++++++++++++++++++++++++ Follow-up: Return in about 2 weeks (around 03/21/2017).   Pertinent documentation may be included in additional procedure notes, imaging studies, problem based documentation and patient instructions. Please see these sections of the encounter for additional information regarding this visit. CMA/ATC served as Neurosurgeonscribe during this visit. History, Physical, and Plan performed by medical provider. Documentation and orders reviewed and attested to.      Brenda MewsMichael D Rigby, DO    [facility] Physician

## 2017-03-11 NOTE — Assessment & Plan Note (Signed)
Fairly significant functional disturbances of her cervical spine and significant osteopathic findings cranial dysfunction.  Osteopathic manipulation performed today per procedure note.  She tolerated this quite well.  We will plan to follow-up with her in 1 week to check on her progress and consider advancing her therapeutic exercises and likely repeating osteopathic manipulation.  We did discuss having her try gabapentin as well for myofascial component and she is amenable to trying this.  We will plan to follow-up with her once again as above

## 2017-03-21 ENCOUNTER — Encounter: Payer: Self-pay | Admitting: Sports Medicine

## 2017-03-21 ENCOUNTER — Ambulatory Visit: Payer: 59 | Admitting: Sports Medicine

## 2017-03-21 VITALS — BP 98/76 | HR 90 | Ht 65.5 in | Wt 125.8 lb

## 2017-03-21 DIAGNOSIS — Z8669 Personal history of other diseases of the nervous system and sense organs: Secondary | ICD-10-CM | POA: Diagnosis not present

## 2017-03-21 DIAGNOSIS — M9902 Segmental and somatic dysfunction of thoracic region: Secondary | ICD-10-CM

## 2017-03-21 DIAGNOSIS — M9903 Segmental and somatic dysfunction of lumbar region: Secondary | ICD-10-CM

## 2017-03-21 DIAGNOSIS — M9900 Segmental and somatic dysfunction of head region: Secondary | ICD-10-CM | POA: Diagnosis not present

## 2017-03-21 DIAGNOSIS — M9901 Segmental and somatic dysfunction of cervical region: Secondary | ICD-10-CM

## 2017-03-21 DIAGNOSIS — M9908 Segmental and somatic dysfunction of rib cage: Secondary | ICD-10-CM

## 2017-03-21 DIAGNOSIS — M542 Cervicalgia: Secondary | ICD-10-CM

## 2017-03-21 NOTE — Assessment & Plan Note (Signed)
She has responded well and has had decreased frequency of migraines with the gabapentin as well as manipulation.  Continue 100 mg nightly gabapentin at this time and repeat evaluation for consideration of repeat manipulation in 3 weeks.

## 2017-03-21 NOTE — Procedures (Signed)
PROCEDURE NOTE : OSTEOPATHIC MANIPULATION The decision today to treat with Osteopathic Manipulative Therapy (OMT) was based on physical exam findings. Verbal consent was obtained following a discussion with the patient regarding the of risks, benefits and potential side effects, including an acute pain flare,post manipulation soreness and need for repeat treatments. Additionally, we specifically discussed the minimal risk of  injury to neurovascular structures associated with Cervical manipulation.   Contraindications to OMT reviewed and include: NONE  Manipulation was performed as below: Regions Treated: cervial spine, thoracic spine and cranial Techniques used: HVLA, muscle energy, myofascial release and Articulatory The patient tolerated the treatment well and reported Improved symptoms following treatment today. Patient was given medications, exercises, stretches and lifestyle modifications per AVS and verbally.     OSTEOPATHIC/STRUCTURAL EXAM FINDINGS:   SBS compression,  C2 through C4 FRS right T1 ERS right  T4 through T6 NRS right

## 2017-03-21 NOTE — Assessment & Plan Note (Signed)
Persistent ongoing tightness discomfort but improved with osteopathic manipulation and home exercises.  We will have her continue with the use and have her continue working on posterior chain strengthening as well as hip flexor stretching and stabilization as she does seem to be overly tight in her iliopsoas.

## 2017-03-21 NOTE — Progress Notes (Signed)
Brenda Macias. Brenda Macias Sports Medicine [facility] at [facility] [phone]  Brenda Macias - 47 y.o. female MRN [id number]  Date of birth: Jul 18, 1970  Visit Date: 03/21/2017  PCP: Brenda Merl, PA-C   Referred by: Brenda Merl, PA-C   Scribe for today's visit: Stevenson Clinch, CMA     SUBJECTIVE:  Brenda Arnt "[name]" is here for Follow-up (LT sided neck pain, LT shoulder pain, hx of migraine)  Compared to the last office visit, her previously described symptoms are improving, no longer having pain with rotation,  Current symptoms are mild & are non-radiating, LT shoulder pain has resolved.  She has been prescribed Gabapentin 100 mg to take qhs, she is tolerating this better than the 300 mg dose. She has tried icing, mineral ice gel, thermawrap, and muscle relaxer but has d/c these since last visit. She had OMT at last vist and responded well.    ROS Denies night time disturbances. Denies fevers, chills, or night sweats. Denies unexplained weight loss. Denies personal history of cancer. Denies changes in bowel or bladder habits. Denies recent unreported falls. Denies new or worsening dyspnea or wheezing. Reports headaches or dizziness.  Denies numbness, tingling or weakness  In the extremities.  Denies dizziness or presyncopal episodes Denies lower extremity edema     HISTORY & PERTINENT PRIOR DATA:  Prior History reviewed and updated per electronic medical record.  Significant history, findings, studies and interim changes include:  reports that  has never smoked. she has never used smokeless tobacco. Recent Labs    10/22/16 1110  HGBA1C 5.0   No specialty comments available. Problem  Neck Pain On Left Side  History of Migraine    OBJECTIVE:  VS:  HT:5' 5.5" (166.4 cm)   WT:125 lb 12.8 oz (57.1 kg)  BMI:20.61    BP:98/76  HR:90bpm  TEMP: ( )  RESP:98 %   PHYSICAL EXAM: Constitutional: WDWN, Non-toxic  appearing. Psychiatric: Alert & appropriately interactive.  Not depressed or anxious appearing. Respiratory: No increased work of breathing.  Trachea Midline Eyes: Pupils are equal.  EOM intact without nystagmus.  No scleral icterus  EXTREMITIES EXAM: No clubbing or cyanosis appreciated Capillary Refill is normal, less than 2 seconds No significant venous stasis changes Pre-tibial edema: No significant pretibial edema Pedal Pulses: Normal & symmetrically palpable  Sensation: Intact to light touch in all UE dermatomes Strength: Normal in bilateral UE myotomes Reflexes: Normal & symmetric DTRs in bilateral UE myotomes  Back exam: Overall well aligned.  Limited cervical sidebending and rotation to the right greater than left.  Negative Spurling's compression test Lhermitte's compression test.  She has generalized muscle spasms throughout her bilateral paracervical musculature.  Thoracolumbar junction has marked tightness and hip flexor tightness contributing mid back tightness.  ASSESSMENT & PLAN:   1. History of migraine   2. Segmental and somatic dysfunction of head region   3. Segmental and somatic dysfunction of cervical region   4. Segmental and somatic dysfunction of thoracic region   5. Segmental and somatic dysfunction of lumbar region   6. Segmental and somatic dysfunction of rib cage   7. Neck pain on left side    PLAN:    Neck pain on left side Persistent ongoing tightness discomfort but improved with osteopathic manipulation and home exercises.  We will have her continue with the use and have her continue working on posterior chain strengthening as well as hip flexor stretching and stabilization as she does seem  to be overly tight in her iliopsoas.  History of migraine She has responded well and has had decreased frequency of migraines with the gabapentin as well as manipulation.  Continue 100 mg nightly gabapentin at this time and repeat evaluation for consideration of  repeat manipulation in 3 weeks.   ++++++++++++++++++++++++++++++++++++++++++++ Orders & Meds: Orders Placed This Encounter  Procedures  . OSTEOPATHIC MANIPULATION TREATMENT    No orders of the defined types were placed in this encounter.   ++++++++++++++++++++++++++++++++++++++++++++ Follow-up: Return in about 3 weeks (around 04/11/2017).   Pertinent documentation may be included in additional procedure notes, imaging studies, problem based documentation and patient instructions. Please see these sections of the encounter for additional information regarding this visit. CMA/ATC served as Neurosurgeonscribe during this visit. History, Physical, and Plan performed by medical provider. Documentation and orders reviewed and attested to.      Andrena MewsMichael D Romon Devereux, DO    [facility] Physician

## 2017-03-21 NOTE — Procedures (Signed)
PROCEDURE NOTE : OSTEOPATHIC MANIPULATION The decision today to treat with Osteopathic Manipulative Therapy (OMT) was based on physical exam findings. Verbal consent was obtained following a discussion with the patient regarding the of risks, benefits and potential side effects, including an acute pain flare,post manipulation soreness and need for repeat treatments. Additionally, we specifically discussed the minimal risk of  injury to neurovascular structures associated with Cervical manipulation.   Contraindications to OMT reviewed and include: NONE  Manipulation was performed as below: Regions Treated: head, cervial spine, lumbar spine, thoracic spine and ribs Techniques used: HVLA, muscle energy, myofascial release and Articulatory, counterstrain The patient tolerated the treatment well and reported Improved symptoms following treatment today. Patient was given medications, exercises, stretches and lifestyle modifications per AVS and verbally.     OSTEOPATHIC/STRUCTURAL EXAM FINDINGS:    SBS compression  C3 flexed rotated right  OA FRS left  C7 extended, rotated right, side bent left  T1 ERS left  T2 through T4 neutral rotated right, side bent left  Posterior rib 6 on the left  L1 through L4 neutral rotated left, side bent right

## 2017-03-25 ENCOUNTER — Encounter: Payer: Self-pay | Admitting: Neurology

## 2017-03-25 ENCOUNTER — Other Ambulatory Visit: Payer: Self-pay | Admitting: *Deleted

## 2017-04-01 ENCOUNTER — Ambulatory Visit: Payer: 59 | Admitting: Physician Assistant

## 2017-04-01 ENCOUNTER — Encounter: Payer: Self-pay | Admitting: Physician Assistant

## 2017-04-01 ENCOUNTER — Other Ambulatory Visit: Payer: Self-pay

## 2017-04-01 VITALS — BP 98/70 | HR 83 | Temp 97.9°F | Resp 14 | Ht 66.0 in | Wt 126.0 lb

## 2017-04-01 DIAGNOSIS — K2950 Unspecified chronic gastritis without bleeding: Secondary | ICD-10-CM | POA: Diagnosis not present

## 2017-04-01 LAB — CBC WITH DIFFERENTIAL/PLATELET
Basophils Absolute: 0.1 10*3/uL (ref 0.0–0.1)
Basophils Relative: 1.3 % (ref 0.0–3.0)
EOS PCT: 5.1 % — AB (ref 0.0–5.0)
Eosinophils Absolute: 0.2 10*3/uL (ref 0.0–0.7)
HCT: 37.5 % (ref 36.0–46.0)
HEMOGLOBIN: 13.2 g/dL (ref 12.0–15.0)
Lymphocytes Relative: 28.9 % (ref 12.0–46.0)
Lymphs Abs: 1.2 10*3/uL (ref 0.7–4.0)
MCHC: 35.1 g/dL (ref 30.0–36.0)
MCV: 86.9 fl (ref 78.0–100.0)
Monocytes Absolute: 0.2 10*3/uL (ref 0.1–1.0)
Monocytes Relative: 5.3 % (ref 3.0–12.0)
Neutro Abs: 2.4 10*3/uL (ref 1.4–7.7)
Neutrophils Relative %: 59.4 % (ref 43.0–77.0)
Platelets: 202 10*3/uL (ref 150.0–400.0)
RBC: 4.31 Mil/uL (ref 3.87–5.11)
RDW: 12.6 % (ref 11.5–15.5)
WBC: 4 10*3/uL (ref 4.0–10.5)

## 2017-04-01 LAB — LIPASE: LIPASE: 37 U/L (ref 11.0–59.0)

## 2017-04-01 LAB — HEPATIC FUNCTION PANEL
ALBUMIN: 4.5 g/dL (ref 3.5–5.2)
ALT: 17 U/L (ref 0–35)
AST: 16 U/L (ref 0–37)
Alkaline Phosphatase: 42 U/L (ref 39–117)
BILIRUBIN TOTAL: 0.5 mg/dL (ref 0.2–1.2)
Bilirubin, Direct: 0.1 mg/dL (ref 0.0–0.3)
Total Protein: 7.1 g/dL (ref 6.0–8.3)

## 2017-04-01 LAB — H. PYLORI ANTIBODY, IGG: H Pylori IgG: NEGATIVE

## 2017-04-01 MED ORDER — DEXLANSOPRAZOLE 60 MG PO CPDR: 60.0000 mg | DELAYED_RELEASE_CAPSULE | Freq: Every day | ORAL | 1 refills | Status: DC

## 2017-04-01 MED ORDER — SUCRALFATE 1 G PO TABS: 1.0000 g | ORAL_TABLET | Freq: Three times a day (TID) | ORAL | 0 refills | Status: DC

## 2017-04-01 NOTE — Progress Notes (Signed)
Patient presents to clinic today c/o intermittent epigastric pain after eating with GERD. Pain is radiating to the back when occurring. Lasting about 30 minutes to a few hours each time. Has history of GERD and gastritis treated with Dexilant. Underwent EGD and capsule study without sign of ulceration. Denies diarrhea, melena or hematochezia. Denies vomiting but notes nausea. Endorses globus and chronic cough in the morning. Dentist has noted erosion secondary to acid reflux. Is not taking anything currently.   Past Medical History:  Diagnosis Date  . Endometriosis   . Kidney stones    Cyst and Kidney Stones  . Migraines    pt states onset around the age of 67    Current Outpatient Medications on File Prior to Visit  Medication Sig Dispense Refill  . diclofenac (VOLTAREN) 25 MG EC tablet Take 25 mg by mouth daily as needed. Pt unsure of dosage.     . gabapentin (NEURONTIN) 100 MG capsule Start with 1 tab po qhs X 1 week, then increase to 1 tab po bid X 1 week then 1 tab po tid prn (Patient taking differently: Take 100 mg by mouth at bedtime. Start with 1 tab po qhs X 1 week, then increase to 1 tab po bid X 1 week then 1 tab po tid prn) 90 capsule 1  . NASCOBAL 500 MCG/0.1ML SOLN USE ONE SPRAY ONCE WEEKLY  11  . TROKENDI XR 25 MG CP24 Take 1 capsule by mouth daily.  3  . VIVELLE-DOT 0.1 MG/24HR patch APPLY 1 PATCH TWICE WEEKLY  0  . zolmitriptan (ZOMIG) 5 MG tablet Take 5 mg by mouth as needed for migraine.     No current facility-administered medications on file prior to visit.     Allergies  Allergen Reactions  . Penicillins Rash    rash  . Rocephin [Ceftriaxone] Rash    Rash    Family History  Problem Relation Age of Onset  . Endometriosis Mother   . Hypertension Father   . Kidney cancer Father   . Cancer Father        Hx Thyroid  . Healthy Brother   . Healthy Brother     Social History   Socioeconomic History  . Marital status: Married    Spouse name: None  .  Number of children: None  . Years of education: None  . Highest education level: None  Social Needs  . Financial resource strain: None  . Food insecurity - worry: None  . Food insecurity - inability: None  . Transportation needs - medical: None  . Transportation needs - non-medical: None  Occupational History  . None  Tobacco Use  . Smoking status: Never Smoker  . Smokeless tobacco: Never Used  Substance and Sexual Activity  . Alcohol use: No  . Drug use: No  . Sexual activity: Yes    Birth control/protection: Surgical    Comment: Hysterectomy  Other Topics Concern  . None  Social History Narrative  . None   Review of Systems - See HPI.  All other ROS are negative.  BP 98/70   Pulse 83   Temp 97.9 F (36.6 C) (Oral)   Resp 14   Ht 5\' 6"  (1.676 m)   Wt 126 lb (57.2 kg)   SpO2 100%   BMI 20.34 kg/m   Physical Exam  Constitutional: She is oriented to person, place, and time and well-developed, well-nourished, and in no distress.  HENT:  Head: Normocephalic and atraumatic.  Eyes:  Conjunctivae are normal.  Neck: Neck supple.  Cardiovascular: Normal rate, regular rhythm, normal heart sounds and intact distal pulses.  Pulmonary/Chest: Effort normal and breath sounds normal. No respiratory distress. She has no wheezes. She has no rales. She exhibits no tenderness.  Abdominal: Soft. Bowel sounds are normal. She exhibits no distension and no mass. There is tenderness in the epigastric area. There is no rebound and no guarding.  Neurological: She is alert and oriented to person, place, and time.  Skin: Skin is warm and dry. No rash noted.  Psychiatric: Affect normal.  Vitals reviewed.  Assessment/Plan: 1. Other chronic gastritis without hemorrhage Restart Dexilant. Carafate RX sent as well. GERD diet reviewed with patient. She is to keep a journal of symptoms/foods to find triggers. No late-night eating. Will check lab panel as noted below for further assessment. Will  likely need referral back to Gastroenterology. Close follow-up scheduled.   - CBC w/Diff - Hepatic function panel - Lipase - dexlansoprazole (DEXILANT) 60 MG capsule; Take 1 capsule (60 mg total) by mouth daily.  Dispense: 30 capsule; Refill: 1 - sucralfate (CARAFATE) 1 g tablet; Take 1 tablet (1 g total) by mouth 4 (four) times daily -  with meals and at bedtime.  Dispense: 60 tablet; Refill: 0 - H. pylori antibody, IgG   Piedad ClimesWilliam Cody Duel Conrad, PA-C

## 2017-04-01 NOTE — Patient Instructions (Signed)
Please go to the lab today for blood work.  I will call you with your results. We will alter treatment regimen(s) if indicated by your results.   Start the Dexilant daily as directed. Start the Carafate, taking as directed over the next 1-2 weeks.  Keep a symptom/food journal so we can find your triggers.   Follow-up with me in 2 weeks.    Food Choices for Gastroesophageal Reflux Disease, Adult When you have gastroesophageal reflux disease (GERD), the foods you eat and your eating habits are very important. Choosing the right foods can help ease your discomfort. What guidelines do I need to follow?  Choose fruits, vegetables, whole grains, and low-fat dairy products.  Choose low-fat meat, fish, and poultry.  Limit fats such as oils, salad dressings, butter, nuts, and avocado.  Keep a food diary. This helps you identify foods that cause symptoms.  Avoid foods that cause symptoms. These may be different for everyone.  Eat small meals often instead of 3 large meals a day.  Eat your meals slowly, in a place where you are relaxed.  Limit fried foods.  Cook foods using methods other than frying.  Avoid drinking alcohol.  Avoid drinking large amounts of liquids with your meals.  Avoid bending over or lying down until 2-3 hours after eating. What foods are not recommended? These are some foods and drinks that may make your symptoms worse: Vegetables Tomatoes. Tomato juice. Tomato and spaghetti sauce. Chili peppers. Onion and garlic. Horseradish. Fruits Oranges, grapefruit, and lemon (fruit and juice). Meats High-fat meats, fish, and poultry. This includes hot dogs, ribs, ham, sausage, salami, and bacon. Dairy Whole milk and chocolate milk. Sour cream. Cream. Butter. Ice cream. Cream cheese. Drinks Coffee and tea. Bubbly (carbonated) drinks or energy drinks. Condiments Hot sauce. Barbecue sauce. Sweets/Desserts Chocolate and cocoa. Donuts. Peppermint and spearmint. Fats  and Oils High-fat foods. This includes French fries[place] and potato chips. Other Vinegar. Strong spices. This includes black pepper, white pepper, red pepper, cayenne, curry powder, cloves, ginger, and chili powder. The items listed above may not be a complete list of foods and drinks to avoid. Contact your dietitian for more information. This information is not intended to replace advice given to you by your health care provider. Make sure you discuss any questions you have with your health care provider. Document Released: 08/21/2011 Document Revised: 07/28/2015 Document Reviewed: 12/24/2012 Elsevier Interactive Patient Education  2017 [employer]

## 2017-04-11 ENCOUNTER — Ambulatory Visit: Payer: 59 | Admitting: Sports Medicine

## 2017-04-15 ENCOUNTER — Ambulatory Visit: Payer: 59 | Admitting: Physician Assistant

## 2017-04-15 ENCOUNTER — Encounter: Payer: Self-pay | Admitting: Neurology

## 2017-04-15 ENCOUNTER — Ambulatory Visit: Payer: 59 | Admitting: Neurology

## 2017-04-15 VITALS — BP 124/74 | HR 89 | Ht 66.0 in | Wt 127.0 lb

## 2017-04-15 DIAGNOSIS — G43009 Migraine without aura, not intractable, without status migrainosus: Secondary | ICD-10-CM

## 2017-04-15 DIAGNOSIS — G44219 Episodic tension-type headache, not intractable: Secondary | ICD-10-CM

## 2017-04-15 NOTE — Progress Notes (Signed)
NEUROLOGY FOLLOW UP OFFICE NOTE  Brenda Macias [id number]  HISTORY OF PRESENT ILLNESS: Brenda Macias is a 47 year old female who follows up for migraines.     UPDATE: Following Botox, she usually develops neck and shoulder pain that subsides after a couple of weeks.  However, the pain persisted following Botox in September.  She described left sided neck pain radiating down to the shoulder, aggravated with palpation and pressure to the upper paraspinal thoracic region.  I recommended holding of Botox but she wanted to proceed with the next round in December.  At that time, I did not inject the left trapezius.  On my assessment at that time, I did not think that the neck pain was a direct result of the Botox, so I referred her to Dr. Berline Chough at [facility] for evaluation and treatment with OMM.  She is doing much better.  Intensity:  Moderate (severe if migraine) Duration:  2 hours Frequency:  8 per month (once a month severe migraine) Current NSAIDS:  diclofenac ER 100mg  daily PRN Current analgesics:  Fioricet (only for severe tension headaches) Current triptans:  Zomig ZMT 5mg , Zembrace SymTouch (has not used) Current anti-emetic:  no Current muscle relaxants:  no Current anti-anxiolytic:  no Current sleep aide:  no Current Antihypertensive medications:  no Current Antidepressant medications:  no Current Anticonvulsant medications:  Trokendi XR 25mg  (50mg  causes drowsiness), gabapentin 100mg  at bedtime Current Vitamins/Herbal/Supplements:  B12 Current Antihistamines/Decongestants:  no Other therapy:  Botox   Caffeine:  1 coke/day, green tea Alcohol:  no Smoker:  no Diet:  Hydrates.  Follows strict diet Exercise:  yes Depression/anxiety:  no Sleep hygiene:  good   HISTORY: Onset:  childhood Location:  Frontal or unilateral either side Quality:  stabbing Initial Intensity:  10/10 Aura:  Flashing blue lights and floaters in vision Prodrome:  Fatigue, hunger,  irritability Postdrome:  fatigue Associated symptoms:  Photophobia, phonophobia, osmophobia.  Rarely nausea.  She has not had any new worse headache of her life, waking up from sleep Initial Duration:  1 to 3 days (no more than 2 hours with Zomig except for when she wakes up with migraine, in which they last longer) Initial Frequency: Prior to Botox, almost daily.  Since Botox, 1 migraine day a month and total of 4 or 5 headache days a month. Triggers/exacerbating factors:  Cigarette smoke, perfumes, cheese, citrus, sleep deprivation, too much sleep, menstrual cycle, change in barometric pressure. Relieving factors:  Water, sleep, rest Activity:  Aggravates She also has bi-temporal non-throbbing tension type headaches.  They have been daily since missing her last Botox in March ([place]   Past NSAIDS:  ibuprofen, naproxen Past analgesics:  acetaminophen, Excedrin Past abortive triptans:  Zecuity patch, Maxalt (ineffective), Relpax (side effects) Past muscle relaxants:  Robaxin Past anti-emetic:  no Past antihypertensive medications:  No.  Propranolol not good option due to hypotension. Past antidepressant medications:  Cannot recall Past anticonvulsant medications:  none Other past therapies:  no   Family history of headache:  Mother, brother   MRI of brain without contrast from 11/02/13 was personally reviewed and demonstrated minimal and subtle foci in the left periventricular and parietal subcortical white matter.  A follow up MRI of brain with contrast was performed on 11/13/13, which revealed no abnormal enhancement.  PAST MEDICAL HISTORY: Past Medical History:  Diagnosis Date  . Endometriosis   . Kidney stones    Cyst and Kidney Stones  . Migraines  pt states onset around the age of 47    MEDICATIONS: Current Outpatient Medications on File Prior to Visit  Medication Sig Dispense Refill  . dexlansoprazole (DEXILANT) 60 MG capsule Take 1 capsule (60 mg  total) by mouth daily. 30 capsule 1  . diclofenac (VOLTAREN) 25 MG EC tablet Take 25 mg by mouth daily as needed. Pt unsure of dosage.     . gabapentin (NEURONTIN) 100 MG capsule Start with 1 tab po qhs X 1 week, then increase to 1 tab po bid X 1 week then 1 tab po tid prn (Patient taking differently: Take 100 mg by mouth at bedtime. Start with 1 tab po qhs X 1 week, then increase to 1 tab po bid X 1 week then 1 tab po tid prn) 90 capsule 1  . NASCOBAL 500 MCG/0.1ML SOLN USE ONE SPRAY ONCE WEEKLY  11  . sucralfate (CARAFATE) 1 g tablet Take 1 tablet (1 g total) by mouth 4 (four) times daily -  with meals and at bedtime. 60 tablet 0  . TROKENDI XR 25 MG CP24 Take 1 capsule by mouth daily.  3  . VIVELLE-DOT 0.1 MG/24HR patch APPLY 1 PATCH TWICE WEEKLY  0  . zolmitriptan (ZOMIG) 5 MG tablet Take 5 mg by mouth as needed for migraine.     No current facility-administered medications on file prior to visit.     ALLERGIES: Allergies  Allergen Reactions  . Penicillins Rash    rash  . Rocephin [Ceftriaxone] Rash    Rash    FAMILY HISTORY: Family History  Problem Relation Age of Onset  . Endometriosis Mother   . Hypertension Father   . Kidney cancer Father   . Cancer Father        Hx Thyroid  . Healthy Brother   . Healthy Brother     SOCIAL HISTORY: Social History   Socioeconomic History  . Marital status: Married    Spouse name: Not on file  . Number of children: Not on file  . Years of education: Not on file  . Highest education level: Not on file  Social Needs  . Financial resource strain: Not on file  . Food insecurity - worry: Not on file  . Food insecurity - inability: Not on file  . Transportation needs - medical: Not on file  . Transportation needs - non-medical: Not on file  Occupational History  . Not on file  Tobacco Use  . Smoking status: Never Smoker  . Smokeless tobacco: Never Used  Substance and Sexual Activity  . Alcohol use: No  . Drug use: No  . Sexual  activity: Yes    Birth control/protection: Surgical    Comment: Hysterectomy  Other Topics Concern  . Not on file  Social History Narrative  . Not on file    REVIEW OF SYSTEMS: Constitutional: No fevers, chills, or sweats, no generalized fatigue, change in appetite Eyes: No visual changes, double vision, eye pain Ear, nose and throat: No hearing loss, ear pain, nasal congestion, sore throat Cardiovascular: No chest pain, palpitations Respiratory:  No shortness of breath at rest or with exertion, wheezes GastrointestinaI: No nausea, vomiting, diarrhea, abdominal pain, fecal incontinence Genitourinary:  No dysuria, urinary retention or frequency Musculoskeletal:  No neck pain, back pain Integumentary: No rash, pruritus, skin lesions Neurological: as above Psychiatric: No depression, insomnia, anxiety Endocrine: No palpitations, fatigue, diaphoresis, mood swings, change in appetite, change in weight, increased thirst Hematologic/Lymphatic:  No purpura, petechiae. Allergic/Immunologic: no itchy/runny  eyes, nasal congestion, recent allergic reactions, rashes  PHYSICAL EXAM: Vitals:   04/15/17 1304  BP: 124/74  Pulse: 89  SpO2: 99%   General: No acute distress.  Patient appears well-groomed.  normal body habitus. Head:  Normocephalic/atraumatic Eyes:  Fundi examined but not visualized Neurological Exam: alert and oriented to person, place, and time. Attention span and concentration intact, recent and remote memory intact, fund of knowledge intact.  Speech fluent and not dysarthric, language intact.  CN II-XII intact. Bulk and tone normal, muscle strength 5/5 throughout.  Sensation to light touch  intact.  Deep tendon reflexes 2+ throughout.  Finger to nose testing intact.  Gait normal  IMPRESSION: Migraine without aura, not intractable Episodic tension-type headache  PLAN: 1.  Continue Botox and topiramate ER 25mg  2.  Zomig 5mg  as needed. 3.  Follow up for next Botox next  month.  15 minutes spent face to face with patient, over 50% spent discussing management.  Shon Millet, DO  CC:  Waldon Merl, PA-C

## 2017-04-16 ENCOUNTER — Encounter: Payer: Self-pay | Admitting: Sports Medicine

## 2017-04-16 ENCOUNTER — Ambulatory Visit: Payer: 59 | Admitting: Sports Medicine

## 2017-04-16 VITALS — BP 104/78 | HR 72 | Ht 66.0 in | Wt 127.8 lb

## 2017-04-16 DIAGNOSIS — M9901 Segmental and somatic dysfunction of cervical region: Secondary | ICD-10-CM

## 2017-04-16 DIAGNOSIS — M9905 Segmental and somatic dysfunction of pelvic region: Secondary | ICD-10-CM

## 2017-04-16 DIAGNOSIS — M9903 Segmental and somatic dysfunction of lumbar region: Secondary | ICD-10-CM

## 2017-04-16 DIAGNOSIS — M9902 Segmental and somatic dysfunction of thoracic region: Secondary | ICD-10-CM

## 2017-04-16 DIAGNOSIS — M542 Cervicalgia: Secondary | ICD-10-CM

## 2017-04-16 DIAGNOSIS — Z8669 Personal history of other diseases of the nervous system and sense organs: Secondary | ICD-10-CM

## 2017-04-16 DIAGNOSIS — M9908 Segmental and somatic dysfunction of rib cage: Secondary | ICD-10-CM | POA: Diagnosis not present

## 2017-04-16 NOTE — Progress Notes (Signed)
Veverly FellsMichael D. Delorise Shinerigby, DO  [facility] at [address] [place] [phone]  Brenda Macias - 47 y.o. female MRN [id number]  Date of birth: 09/27/1970  Visit Date: 04/16/2017  PCP: Waldon MerlMartin, William C, PA-C   Referred by: Waldon MerlMartin, William C, PA-C   Scribe for today's visit: Christoper FabianMolly Weber, LAT, ATC     SUBJECTIVE:  Brenda Macias "Brenda Macias" is here for Follow-up (Neck pain and migraines) .   Compared to the last office visit on 03/21/17, her previously described neck pain and migraine symptoms are improving w/ slight L-sided neck pain.  States that she feels like she slept on her neck a "little funny." Current symptoms are mild & are nonradiating She has been taking Gabapentin 100mg .   ROS Denies night time disturbances. Denies fevers, chills, or night sweats. Denies unexplained weight loss. Denies personal history of cancer. Denies changes in bowel or bladder habits. Denies recent unreported falls. Denies new or worsening dyspnea or wheezing. Reports headaches or dizziness.  Denies numbness, tingling or weakness  In the extremities.  Denies dizziness or presyncopal episodes Denies lower extremity edema     HISTORY & PERTINENT PRIOR DATA:  Prior History reviewed and updated per electronic medical record.  Significant history, findings, studies and interim changes include:  reports that  has never smoked. she has never used smokeless tobacco. Recent Labs    10/22/16 1110  HGBA1C 5.0   No specialty comments available. Problem  Neck Pain On Left Side    OBJECTIVE:  VS:  HT:5\' 6"  (167.6 cm)   WT:127 lb 12.8 oz (58 kg)  BMI:20.64    BP:104/78  HR:72bpm  TEMP: ( )  RESP:99 %   PHYSICAL EXAM: Constitutional: WDWN, Non-toxic appearing. Psychiatric: Alert & appropriately interactive.  Not depressed or anxious appearing. Respiratory: No increased work of breathing.  Trachea Midline Eyes: Pupils are equal.  EOM intact without nystagmus.  No scleral  icterus  NEUROVASCULAR exam: No clubbing or cyanosis appreciated No significant venous stasis changes Capillary Refill: normal, less than 2 seconds   Neck is overall well aligned without significant deformity or significant torticollis.  She has significantly improved cervical range of motion including side bending and rotation with functional limitations per osteopathic exam.  Negative Spurling's and Lhermitte's compression test.  Upper extremity strength is 5/5.  No significant motor strength weakness in the upper extremities and no symptoms with VBS testing..  No additional findings.   ASSESSMENT & PLAN:   1. Somatic dysfunction of cervical region   2. Somatic dysfunction of thoracic region   3. Somatic dysfunction of rib cage region   4. Somatic dysfunction of lumbar region   5. Somatic dysfunction of pelvis region   6. Neck pain on left side   7. History of migraine    PLAN:    Neck pain on left side She has been responding well to osteopathic manipulation as well as home therapeutic exercises.  She reports this is the best she has felt in many years and the generalized fogginess that she has from her headaches has also subsequently improved.  We will plan to have her follow-up in 4-6 weeks for consideration of repeat osteopathic manipulation and check on her progress at that time.  She does have upcoming repeat Botox injections   ++++++++++++++++++++++++++++++++++++++++++++ Orders & Meds: No orders of the defined types were placed in this encounter.   No orders of the defined types were placed in this encounter.   ++++++++++++++++++++++++++++++++++++++++++++ Follow-up: Return in  about 4 weeks (around 05/14/2017).   Pertinent documentation may be included in additional procedure notes, imaging studies, problem based documentation and patient instructions. Please see these sections of the encounter for additional information regarding this visit. CMA/ATC served as Neurosurgeon during  this visit. History, Physical, and Plan performed by medical provider. Documentation and orders reviewed and attested to.      [name], DO    [facility]

## 2017-04-16 NOTE — Procedures (Signed)
PROCEDURE NOTE : OSTEOPATHIC MANIPULATION The decision today to treat with Osteopathic Manipulative Therapy (OMT) was based on physical exam findings. Verbal consent was obtained following a discussion with the patient regarding the of risks, benefits and potential side effects, including an acute pain flare,post manipulation soreness and need for repeat treatments.    Contraindications to OMT reviewed and include: NONE  Manipulation was performed as below: Regions Treated: cervial spine, lumbar spine, thoracic spine, sacrum and ribs Techniques used: HVLA, muscle energy, myofascial release and Articulatory The patient tolerated the treatment well and reported Improved symptoms following treatment today. Patient was given medications, exercises, stretches and lifestyle modifications per AVS and verbally.     OSTEOPATHIC/STRUCTURAL EXAM FINDINGS:    C7 ERS right  C5-6 FRS right  C3 FRS left  T2 through 4 neutral rotated left, side bent right  Posterior rib 8 on the right   L3 FRS right  Right anterior innominate

## 2017-04-16 NOTE — Assessment & Plan Note (Signed)
She has been responding well to osteopathic manipulation as well as home therapeutic exercises.  She reports this is the best she has felt in many years and the generalized fogginess that she has from her headaches has also subsequently improved.  We will plan to have her follow-up in 4-6 weeks for consideration of repeat osteopathic manipulation and check on her progress at that time.  She does have upcoming repeat Botox injections

## 2017-04-22 ENCOUNTER — Ambulatory Visit: Payer: Managed Care, Other (non HMO) | Admitting: Physician Assistant

## 2017-04-22 ENCOUNTER — Other Ambulatory Visit: Payer: Self-pay

## 2017-04-22 ENCOUNTER — Encounter: Payer: Self-pay | Admitting: Physician Assistant

## 2017-04-22 ENCOUNTER — Telehealth: Payer: Self-pay | Admitting: Emergency Medicine

## 2017-04-22 VITALS — BP 100/70 | HR 77 | Temp 98.4°F | Resp 14 | Ht 66.0 in | Wt 127.0 lb

## 2017-04-22 DIAGNOSIS — K2950 Unspecified chronic gastritis without bleeding: Secondary | ICD-10-CM | POA: Diagnosis not present

## 2017-04-22 NOTE — Telephone Encounter (Signed)
Prior Authorization was started on 04/01/17. Unsure what the delay was for approval or denial. Check status today.  Dexilant approved 03/23/17-04/22/19 Patient advised at [facility]. Pharmacy faxed the approval.

## 2017-04-22 NOTE — Patient Instructions (Signed)
Now that the Dexilant is approved, please start daily. Try to wean down off of the Carafate over the next week. Keep an eye out for trigger foods while on the medication. You will be contacted for assessment with Gastroenterology. If you have not seen then within 2 weeks, please follow-up with me in office.

## 2017-04-22 NOTE — Progress Notes (Signed)
Patient presents to clinic today for follow-up of GERD. Patient endorses she was unable to get the Dexilant yet waiting on insurance approval (a Prior Berkley Harvey was completed at last visit). Is taking OTC PPI and Carafate (BID) which is helping slightly. Is keeping a journal for trigger foods. Is trying to cut back on them. Notes nausea without vomiting. Still denies fever, chills, change to bowel habits.   Past Medical History:  Diagnosis Date  . Endometriosis   . Kidney stones    Cyst and Kidney Stones  . Migraines    pt states onset around the age of 89    Current Outpatient Medications on File Prior to Visit  Medication Sig Dispense Refill  . diclofenac (VOLTAREN) 25 MG EC tablet Take 25 mg by mouth daily as needed. Pt unsure of dosage.     . gabapentin (NEURONTIN) 100 MG capsule Start with 1 tab po qhs X 1 week, then increase to 1 tab po bid X 1 week then 1 tab po tid prn (Patient taking differently: Take 100 mg by mouth at bedtime. Start with 1 tab po qhs X 1 week, then increase to 1 tab po bid X 1 week then 1 tab po tid prn) 90 capsule 1  . NASCOBAL 500 MCG/0.1ML SOLN USE ONE SPRAY ONCE WEEKLY  11  . sucralfate (CARAFATE) 1 g tablet Take 1 tablet (1 g total) by mouth 4 (four) times daily -  with meals and at bedtime. 60 tablet 0  . TROKENDI XR 25 MG CP24 Take 1 capsule by mouth daily.  3  . VIVELLE-DOT 0.1 MG/24HR patch APPLY 1 PATCH TWICE WEEKLY  0  . zolmitriptan (ZOMIG) 5 MG tablet Take 5 mg by mouth as needed for migraine.    Marland Kitchen dexlansoprazole (DEXILANT) 60 MG capsule Take 1 capsule (60 mg total) by mouth daily. (Patient not taking: Reported on 04/22/2017) 30 capsule 1   No current facility-administered medications on file prior to visit.     Allergies  Allergen Reactions  . Penicillins Rash    rash  . Rocephin [Ceftriaxone] Rash    Rash    Family History  Problem Relation Age of Onset  . Endometriosis Mother   . Hypertension Father   . Kidney cancer Father   . Cancer  Father        Hx Thyroid  . Healthy Brother   . Healthy Brother     Social History   Socioeconomic History  . Marital status: Married    Spouse name: None  . Number of children: None  . Years of education: None  . Highest education level: None  Social Needs  . Financial resource strain: None  . Food insecurity - worry: None  . Food insecurity - inability: None  . Transportation needs - medical: None  . Transportation needs - non-medical: None  Occupational History  . None  Tobacco Use  . Smoking status: Never Smoker  . Smokeless tobacco: Never Used  Substance and Sexual Activity  . Alcohol use: No  . Drug use: No  . Sexual activity: Yes    Birth control/protection: Surgical    Comment: Hysterectomy  Other Topics Concern  . None  Social History Narrative  . None   Review of Systems - See HPI.  All other ROS are negative.  BP 100/70   Pulse 77   Temp 98.4 F (36.9 C) (Oral)   Resp 14   Ht 5\' 6"  (1.676 m)   Wt 127  lb (57.6 kg)   SpO2 99%   BMI 20.50 kg/m   Physical Exam  Constitutional: She is oriented to person, place, and time and well-developed, well-nourished, and in no distress.  HENT:  Head: Normocephalic and atraumatic.  Eyes: Conjunctivae are normal.  Neck: Neck supple.  Cardiovascular: Normal rate, regular rhythm, normal heart sounds and intact distal pulses.  Pulmonary/Chest: Effort normal.  Abdominal: Soft. Bowel sounds are normal. There is no hepatosplenomegaly. There is tenderness in the epigastric area and left upper quadrant. There is no rebound.  Neurological: She is alert and oriented to person, place, and time.  Skin: Skin is warm and dry. No rash noted.  Psychiatric: Affect normal.  Vitals reviewed.  Recent Results (from the past 2160 hour(s))  CBC w/Diff     Status: Abnormal   Collection Time: 04/01/17  9:49 AM  Result Value Ref Range   WBC 4.0 4.0 - 10.5 K/uL   RBC 4.31 3.87 - 5.11 Mil/uL   Hemoglobin 13.2 12.0 - 15.0 g/dL   HCT  16.137.5 09.636.0 - 04.546.0 %   MCV 86.9 78.0 - 100.0 fl   MCHC 35.1 30.0 - 36.0 g/dL   RDW 40.912.6 81.111.5 - 91.415.5 %   Platelets 202.0 150.0 - 400.0 K/uL   Neutrophils Relative % 59.4 43.0 - 77.0 %   Lymphocytes Relative 28.9 12.0 - 46.0 %   Monocytes Relative 5.3 3.0 - 12.0 %   Eosinophils Relative 5.1 (H) 0.0 - 5.0 %   Basophils Relative 1.3 0.0 - 3.0 %   Neutro Abs 2.4 1.4 - 7.7 K/uL   Lymphs Abs 1.2 0.7 - 4.0 K/uL   Monocytes Absolute 0.2 0.1 - 1.0 K/uL   Eosinophils Absolute 0.2 0.0 - 0.7 K/uL   Basophils Absolute 0.1 0.0 - 0.1 K/uL  Hepatic function panel     Status: None   Collection Time: 04/01/17  9:49 AM  Result Value Ref Range   Total Bilirubin 0.5 0.2 - 1.2 mg/dL   Bilirubin, Direct 0.1 0.0 - 0.3 mg/dL   Alkaline Phosphatase 42 39 - 117 U/L   AST 16 0 - 37 U/L   ALT 17 0 - 35 U/L   Total Protein 7.1 6.0 - 8.3 g/dL   Albumin 4.5 3.5 - 5.2 g/dL  Lipase     Status: None   Collection Time: 04/01/17  9:49 AM  Result Value Ref Range   Lipase 37.0 11.0 - 59.0 U/L  H. pylori antibody, IgG     Status: None   Collection Time: 04/01/17  9:49 AM  Result Value Ref Range   H Pylori IgG Negative Negative   Assessment/Plan: 1. Other chronic gastritis without hemorrhage Approval for Dexilant has been given. She is picking up and starting Rx today. Continue GERD diet and keeping symptoms journal. Referral to GI placed. Follow-up 2 weeks.    Piedad ClimesWilliam Cody Kylin Dubs, PA-C

## 2017-04-25 ENCOUNTER — Other Ambulatory Visit: Payer: Self-pay | Admitting: Emergency Medicine

## 2017-04-25 DIAGNOSIS — K2950 Unspecified chronic gastritis without bleeding: Secondary | ICD-10-CM

## 2017-04-25 MED ORDER — SUCRALFATE 1 G PO TABS: 1.0000 g | ORAL_TABLET | Freq: Three times a day (TID) | ORAL | 5 refills | Status: DC

## 2017-05-10 ENCOUNTER — Encounter: Payer: Self-pay | Admitting: *Deleted

## 2017-05-14 ENCOUNTER — Ambulatory Visit: Payer: Managed Care, Other (non HMO) | Admitting: Sports Medicine

## 2017-05-14 ENCOUNTER — Encounter: Payer: Self-pay | Admitting: Sports Medicine

## 2017-05-14 VITALS — BP 102/78 | HR 81 | Ht 66.0 in | Wt 128.0 lb

## 2017-05-14 DIAGNOSIS — M542 Cervicalgia: Secondary | ICD-10-CM | POA: Diagnosis not present

## 2017-05-14 DIAGNOSIS — Z8669 Personal history of other diseases of the nervous system and sense organs: Secondary | ICD-10-CM | POA: Diagnosis not present

## 2017-05-14 DIAGNOSIS — M9901 Segmental and somatic dysfunction of cervical region: Secondary | ICD-10-CM

## 2017-05-14 DIAGNOSIS — M9900 Segmental and somatic dysfunction of head region: Secondary | ICD-10-CM

## 2017-05-14 DIAGNOSIS — M9908 Segmental and somatic dysfunction of rib cage: Secondary | ICD-10-CM | POA: Diagnosis not present

## 2017-05-14 DIAGNOSIS — M9902 Segmental and somatic dysfunction of thoracic region: Secondary | ICD-10-CM

## 2017-05-14 NOTE — Procedures (Signed)
PROCEDURE NOTE : OSTEOPATHIC MANIPULATION The decision today to treat with Osteopathic Manipulative Therapy (OMT) was based on physical exam findings. Verbal consent was obtained following a discussion with the patient regarding the of risks, benefits and potential side effects, including an acute pain flare,post manipulation soreness and need for repeat treatments.     NONE  Manipulation was performed as below: Regions Treated: head, cervial spine, thoracic spine, Ribs and upper extremities Techniques used: HVLA, muscle energy, myofascial release and Articulatory   The patient tolerated the treatment well and reported Improved symptoms following treatment today. Patient was given medications, exercises, stretches and lifestyle modifications per AVS and verbally.     OSTEOPATHIC/STRUCTURAL EXAM FINDINGS:     SOMATIC DYSFUNCTION Significantly tight right paracervical musculature. Limited side bending to the right as well as restricted rotation to the left.  No pain with Spurling's compression test or Lhermitte's compression test. Negative VBS testing. C2 flexed, rotated right C4 flexed, rotated right C6 flexed, rotated left, side bent right C7 extended rotated right T2 through the 4 neutral rotated right, side bent left T6 through T8 neutral, rotated left, side bent right Posterior rib 10 on the right Cranial: SBS compression

## 2017-05-14 NOTE — Patient Instructions (Signed)

## 2017-05-14 NOTE — Progress Notes (Signed)
Veverly FellsMichael D. Delorise Shinerigby, DO  [facility] [place] [phone]  Brenda Arntimothy Lia - 47 y.o. female MRN [id number]  Date of birth: 02/09/1971  Visit Date: 05/14/2017  PCP: Waldon MerlMartin, William C, PA-C   Referred by: Waldon MerlMartin, William C, PA-C   Scribe for today's visit: Stevenson ClinchBrandy Coleman, CMA     SUBJECTIVE:  Brenda Arntimothy Weirauch "Brenda Macias" is here for Follow-up (neck pain)  03/07/17: Her LT-sided neck and shoulder pain symptoms INITIALLY: Began about 2 months ago and the is no known MOI.  Neck pain: Described as mild stabbing pain, radiating to the upper back, shoulder, and neck.  Worsened with turning head to the LT or RT, LT > RT. Pain is worse when lying down at night.  Improved with rest. She has been seen by a chiropractor in the past, years ago, and got some relief of pain associated with migraine HA.  Additional associated symptoms include: LT shoulder pain At this time symptoms are worsening compared to onset, severity is getting worse over time.  LT shoulder: Described as moderate-severe aching at rest but shooting stabbing pain with movement.  Worsened with movement.  Improved with rest.  Additional associated symptoms include: LT-sided neck pain.     05/14/17: Compared to the last office visit, her previously described symptoms are improving, 1/10 Current symptoms are mild & are nonradiating She has been taking Gabapentin 100 mg aPM. She is experiencing increased fatigue but its manageable.  She received OMT 04/16/17. Still has slight pain when turning head side-to-side, it had completely resolved but returned over the past few days.    ROS Reports night time disturbances. Denies fevers, chills, or night sweats. Denies unexplained weight loss. Denies personal history of cancer. Denies changes in bowel or bladder habits. Denies recent unreported falls. Denies new or worsening dyspnea or wheezing. Reports headaches or dizziness.  Denies numbness,  tingling or weakness  In the extremities.  Denies dizziness or presyncopal episodes Denies lower extremity edema    HISTORY & PERTINENT PRIOR DATA:  Prior History reviewed and updated per electronic medical record.  Significant history, findings, studies and interim changes include:  reports that  has never smoked. she has never used smokeless tobacco. Recent Labs    10/22/16 1110  HGBA1C 5.0   No specialty comments available. Problem  Neck Pain    OBJECTIVE:  VS:  HT:5\' 6"  (167.6 cm)   WT:128 lb (58.1 kg)  BMI:20.67    BP:102/78  HR:81bpm  TEMP: ( )  RESP:99 %   PHYSICAL EXAM: Constitutional: WDWN, Non-toxic appearing. Psychiatric: Alert & appropriately interactive.  Not depressed or anxious appearing. Respiratory: No increased work of breathing.  Trachea Midline Eyes: Pupils are equal.  EOM intact without nystagmus.  No scleral icterus  NEUROVASCULAR exam: No clubbing or cyanosis appreciated No significant venous stasis changes Capillary Refill: normal, less than 2 seconds   Please see osteopathic exam findings.  She does report having a migraine today and there was a slight initial exacerbation following manipulation.  Overall though feeling better at the time of leaving. No significant upper extremity symptoms.  No significant upper extremity weakness or dysesthesia.   ASSESSMENT & PLAN:   1. History of migraine   2. Neck pain   3. Somatic dysfunction of cervical region   4. Somatic dysfunction of thoracic region   5. Somatic dysfunction of rib cage region   6. Somatic dysfunction of head region     Orders & Meds: No orders of  the defined types were placed in this encounter.  No orders of the defined types were placed in this encounter.    PLAN: Osteopathic manipulation was performed today based on physical exam findings.  Please see procedure note for further information including Osteopathic Exam findings. She has done quite well with manipulation  reports this is best she has felt in years.  She has upcoming injections for spasticity and do think that these will continue to provide her some benefit as well.  She should continue with home therapeutic exercises and foundations training exercises provided today. Links to [employer] provided today per Patient Instructions.  These exercises were developed by Myles Lipps, DC with a strong emphasis on core neuromuscular reducation and postural realignment through body-weight exercises.   Follow-up: Return in about 4 weeks (around 06/11/2017).   Pertinent documentation may be included in additional procedure notes, imaging studies, problem based documentation and patient instructions. Please see these sections of the encounter for additional information regarding this visit. CMA/ATC served as Neurosurgeon during this visit. History, Physical, and Plan performed by medical provider. Documentation and orders reviewed and attested to.      Brenda Mews, DO    [facility]

## 2017-05-15 ENCOUNTER — Encounter: Payer: Self-pay | Admitting: Physician Assistant

## 2017-05-15 ENCOUNTER — Other Ambulatory Visit: Payer: Self-pay

## 2017-05-15 ENCOUNTER — Ambulatory Visit: Payer: Managed Care, Other (non HMO) | Admitting: Physician Assistant

## 2017-05-15 ENCOUNTER — Encounter: Payer: Self-pay | Admitting: Gastroenterology

## 2017-05-15 VITALS — BP 92/70 | HR 78 | Temp 97.6°F | Resp 14 | Ht 66.0 in | Wt 128.0 lb

## 2017-05-15 DIAGNOSIS — K2950 Unspecified chronic gastritis without bleeding: Secondary | ICD-10-CM | POA: Diagnosis not present

## 2017-05-15 DIAGNOSIS — K2970 Gastritis, unspecified, without bleeding: Secondary | ICD-10-CM | POA: Insufficient documentation

## 2017-05-15 MED ORDER — RANITIDINE HCL 300 MG PO TABS: 300.0000 mg | ORAL_TABLET | Freq: Every day | ORAL | 2 refills | Status: DC

## 2017-05-15 NOTE — Progress Notes (Signed)
Patient presents to clinic today for follow-up of chronic gastritis. Is taking Dexilant now as directed. Notes resolution of epigastric pain.  Still with decent amount of indigestion and daily heartburn. Endorses nausea. Denies vomiting. Still denies melena, hematochezia or tenesmus. Previous CBC without any sign of anemia. Has not seen GI specialist.    Past Medical History:  Diagnosis Date  . Endometriosis   . Kidney stones    Cyst and Kidney Stones  . Migraines    pt states onset around the age of 47    Current Outpatient Medications on File Prior to Visit  Medication Sig Dispense Refill  . dexlansoprazole (DEXILANT) 60 MG capsule Take 1 capsule (60 mg total) by mouth daily. 30 capsule 1  . diclofenac (VOLTAREN) 25 MG EC tablet Take 25 mg by mouth daily as needed. Pt unsure of dosage.     . gabapentin (NEURONTIN) 100 MG capsule Start with 1 tab po qhs X 1 week, then increase to 1 tab po bid X 1 week then 1 tab po tid prn (Patient taking differently: Take 100 mg by mouth at bedtime. Start with 1 tab po qhs X 1 week, then increase to 1 tab po bid X 1 week then 1 tab po tid prn) 90 capsule 1  . NASCOBAL 500 MCG/0.1ML SOLN USE ONE SPRAY ONCE WEEKLY  11  . TROKENDI XR 25 MG CP24 Take 1 capsule by mouth daily.  3  . VIVELLE-DOT 0.1 MG/24HR patch APPLY 1 PATCH TWICE WEEKLY  0  . zolmitriptan (ZOMIG) 5 MG tablet Take 5 mg by mouth as needed for migraine.     No current facility-administered medications on file prior to visit.     Allergies  Allergen Reactions  . Penicillins Rash    rash  . Rocephin [Ceftriaxone] Rash    Rash    Family History  Problem Relation Age of Onset  . Endometriosis Mother   . Hypertension Father   . Kidney cancer Father   . Cancer Father        Hx Thyroid  . Healthy Brother   . Healthy Brother     Social History   Socioeconomic History  . Marital status: Married    Spouse name: None  . Number of children: None  . Years of education: None  .  Highest education level: None  Social Needs  . Financial resource strain: None  . Food insecurity - worry: None  . Food insecurity - inability: None  . Transportation needs - medical: None  . Transportation needs - non-medical: None  Occupational History  . None  Tobacco Use  . Smoking status: Never Smoker  . Smokeless tobacco: Never Used  Substance and Sexual Activity  . Alcohol use: No  . Drug use: No  . Sexual activity: Yes    Birth control/protection: Surgical    Comment: Hysterectomy  Other Topics Concern  . None  Social History Narrative  . None    Review of Systems - See HPI.  All other ROS are negative.  BP 92/70   Pulse 78   Temp 97.6 F (36.4 C) (Oral)   Resp 14   Ht 5\' 6"  (1.676 m)   Wt 128 lb (58.1 kg)   SpO2 99%   BMI 20.66 kg/m   Physical Exam  Constitutional: She is oriented to person, place, and time and well-developed, well-nourished, and in no distress.  HENT:  Head: Normocephalic and atraumatic.  Neck: Neck supple.  Cardiovascular: Normal rate,  regular rhythm, normal heart sounds and intact distal pulses.  Pulmonary/Chest: Effort normal and breath sounds normal. No respiratory distress. She has no wheezes. She has no rales. She exhibits no tenderness.  Abdominal: Soft. Bowel sounds are normal. She exhibits no distension and no mass. There is tenderness in the epigastric area and left upper quadrant. There is no rebound and no guarding.  Neurological: She is alert and oriented to person, place, and time.  Skin: Skin is warm and dry. No rash noted.  Psychiatric: Affect normal.  Vitals reviewed.  Recent Results (from the past 2160 hour(s))  CBC w/Diff     Status: Abnormal   Collection Time: 04/01/17  9:49 AM  Result Value Ref Range   WBC 4.0 4.0 - 10.5 K/uL   RBC 4.31 3.87 - 5.11 Mil/uL   Hemoglobin 13.2 12.0 - 15.0 g/dL   HCT 16.1 09.6 - 04.5 %   MCV 86.9 78.0 - 100.0 fl   MCHC 35.1 30.0 - 36.0 g/dL   RDW 40.9 81.1 - 91.4 %   Platelets  202.0 150.0 - 400.0 K/uL   Neutrophils Relative % 59.4 43.0 - 77.0 %   Lymphocytes Relative 28.9 12.0 - 46.0 %   Monocytes Relative 5.3 3.0 - 12.0 %   Eosinophils Relative 5.1 (H) 0.0 - 5.0 %   Basophils Relative 1.3 0.0 - 3.0 %   Neutro Abs 2.4 1.4 - 7.7 K/uL   Lymphs Abs 1.2 0.7 - 4.0 K/uL   Monocytes Absolute 0.2 0.1 - 1.0 K/uL   Eosinophils Absolute 0.2 0.0 - 0.7 K/uL   Basophils Absolute 0.1 0.0 - 0.1 K/uL  Hepatic function panel     Status: None   Collection Time: 04/01/17  9:49 AM  Result Value Ref Range   Total Bilirubin 0.5 0.2 - 1.2 mg/dL   Bilirubin, Direct 0.1 0.0 - 0.3 mg/dL   Alkaline Phosphatase 42 39 - 117 U/L   AST 16 0 - 37 U/L   ALT 17 0 - 35 U/L   Total Protein 7.1 6.0 - 8.3 g/dL   Albumin 4.5 3.5 - 5.2 g/dL  Lipase     Status: None   Collection Time: 04/01/17  9:49 AM  Result Value Ref Range   Lipase 37.0 11.0 - 59.0 U/L  H. pylori antibody, IgG     Status: None   Collection Time: 04/01/17  9:49 AM  Result Value Ref Range   H Pylori IgG Negative Negative    Assessment/Plan: Gastritis Pain has resolved. Still having nausea and significant reflux despite Dexilant daily. Exam with mild epigastric tenderness (chronic). Referral to GI replaced. [facility] is working on getting this scheduled ASAP. Appt has been scheduled. Will add on Ranitidine 300 mg nightly.     Piedad Climes, PA-C

## 2017-05-15 NOTE — Assessment & Plan Note (Signed)
Pain has resolved. Still having nausea and significant reflux despite Dexilant daily. Exam with mild epigastric tenderness (chronic). Referral to GI replaced. [facility]. Appt has been scheduled. Will add on Ranitidine 300 mg nightly.

## 2017-05-15 NOTE — Patient Instructions (Signed)
Please stay well-hydrated. Continue the Dexilant once daily.  Add on the Ranitidine 300 mg each evening. Continue bland diet.  Go to your appointment with GI as scheduled.  Call me on Monday with an update on symptoms.

## 2017-05-22 ENCOUNTER — Encounter: Payer: Self-pay | Admitting: Physician Assistant

## 2017-05-24 ENCOUNTER — Ambulatory Visit: Payer: 59 | Admitting: Neurology

## 2017-05-28 ENCOUNTER — Emergency Department (HOSPITAL_COMMUNITY)
Admission: EM | Admit: 2017-05-28 | Discharge: 2017-05-28 | Disposition: A | Discharge status: Home / Self Care | Payer: Managed Care, Other (non HMO) | Attending: Emergency Medicine | Admitting: Emergency Medicine

## 2017-05-28 ENCOUNTER — Ambulatory Visit: Payer: Self-pay | Admitting: *Deleted

## 2017-05-28 ENCOUNTER — Encounter (HOSPITAL_COMMUNITY): Payer: Self-pay | Admitting: Nurse Practitioner

## 2017-05-28 ENCOUNTER — Other Ambulatory Visit: Payer: Self-pay

## 2017-05-28 DIAGNOSIS — R112 Nausea with vomiting, unspecified: Secondary | ICD-10-CM | POA: Diagnosis not present

## 2017-05-28 DIAGNOSIS — R197 Diarrhea, unspecified: Secondary | ICD-10-CM | POA: Insufficient documentation

## 2017-05-28 DIAGNOSIS — Z79899 Other long term (current) drug therapy: Secondary | ICD-10-CM | POA: Insufficient documentation

## 2017-05-28 LAB — COMPREHENSIVE METABOLIC PANEL
ALK PHOS: 50 U/L (ref 38–126)
ALT: 22 U/L (ref 14–54)
AST: 22 U/L (ref 15–41)
Albumin: 5 g/dL (ref 3.5–5.0)
Anion gap: 12 (ref 5–15)
BUN: 20 mg/dL (ref 6–20)
CALCIUM: 9.9 mg/dL (ref 8.9–10.3)
CHLORIDE: 105 mmol/L (ref 101–111)
CO2: 22 mmol/L (ref 22–32)
CREATININE: 0.82 mg/dL (ref 0.44–1.00)
GFR calc Af Amer: >60 mL/min (ref >60)
GFR calc non Af Amer: >60 mL/min (ref >60)
Glucose, Bld: 120 mg/dL — ABNORMAL HIGH (ref 65–99)
Potassium: 3.5 mmol/L (ref 3.5–5.1)
Sodium: 139 mmol/L (ref 135–145)
Total Bilirubin: 1 mg/dL (ref 0.3–1.2)
Total Protein: 8.4 g/dL — ABNORMAL HIGH (ref 6.5–8.1)

## 2017-05-28 LAB — CBC
HCT: 38.8 % (ref 36.0–46.0)
Hemoglobin: 14 g/dL (ref 12.0–15.0)
MCH: 30.7 pg (ref 26.0–34.0)
MCHC: 36.1 g/dL — ABNORMAL HIGH (ref 30.0–36.0)
MCV: 85.1 fL (ref 78.0–100.0)
PLATELETS: 231 10*3/uL (ref 150–400)
RBC: 4.56 MIL/uL (ref 3.87–5.11)
RDW: 12.6 % (ref 11.5–15.5)
WBC: 11.2 10*3/uL — AB (ref 4.0–10.5)

## 2017-05-28 LAB — LIPASE, BLOOD: LIPASE: 35 U/L (ref 11–51)

## 2017-05-28 MED ORDER — ONDANSETRON 4 MG PO TBDP
4.0000 mg | ORAL_TABLET | Freq: Once | ORAL | Status: AC
  Administered 2017-05-28: 4 mg via ORAL
  Filled 2017-05-28: qty 1

## 2017-05-28 MED ORDER — ONDANSETRON 4 MG PO TBDP: 4.0000 mg | ORAL_TABLET | Freq: Three times a day (TID) | ORAL | 0 refills | Status: AC | PRN

## 2017-05-28 NOTE — Telephone Encounter (Signed)
I returned her call.   She has been vomiting and having diarrhea since 8:00PM last night non-stop.    She can not keep anything down.   What she does not vomiting it is going straight through her via diarrhea.     In talking with her I can hear her mouth is very dry and pasty.   She is c/o being very thirsty but can't keep down sips of Sprite or water without it at this point coming out as diarrhea.  She informed me she has been dealing with a stomach issue and has a GI referral for tomorrow.   This since last night is worse than what she has been dealing with and has been seen for by Marcelline Mates in the office.  I have referred her to the ED at [facility] (she is familiar with where it is).    I have routed a note to Marcelline Mates making him aware of the referral. Reason for Disposition . [1] Drinking very little AND [2] dehydration suspected (e.g., no urine > 12 hours, very dry mouth, very lightheaded)  Answer Assessment - Initial Assessment Questions 1. VOMITING SEVERITY: "How many times have you vomited in the past 24 hours?"     - MILD:  1 - 2 times/day    - MODERATE: 3 - 5 times/day, decreased oral intake without significant weight loss or symptoms of dehydration    - SEVERE: 6 or more times/day, vomits everything or nearly everything, with significant weight loss, symptoms of dehydration      It seems every time I eat I vomit.    This has been going on for a while.  3 2. ONSET: "When did the vomiting begin?"      I've been sick with this stomach issue for a while.    Vomiting and diarrhea since 8:00PM last night.   I'm having a stomach ache right after I eat which is not usual.   I have a GI doctor appt for tomorrow for all of this. 3. FLUIDS: "What fluids or food have you vomited up today?" "Have you been able to keep any fluids down?"     Everything is coming up.   I'm taking sips of Sprite and water for the last couple of hours and keeping it down.   I'm having diarrhea so  I'm not sure I'm keeping any of it down. 4. ABDOMINAL PAIN: "Are your having any abdominal pain?" If yes : "How bad is it and what does it feel like?" (e.g., crampy, dull, intermittent, constant)      Yes.   Only after I eat I have a stomach for a couple of hours.  If I don't eat I'm fine but when I eat it's bad. 5. DIARRHEA: "Is there any diarrhea?" If so, ask: "How many times today?"      Yes.   It started last night at 8:00PM.   I've been up most of the night. 6. CONTACTS: "Is there anyone else in the family with the same symptoms?"      No 7. CAUSE: "What do you think is causing your vomiting?"     I think it's this stomach issue I'm dealing with.   I'm seeing a GI doctor tomorrow. 8. HYDRATION STATUS: "Any signs of dehydration?" (e.g., dry mouth [not only dry lips], too weak to stand) "When did you last urinate?"     I'm real dry.  I have no energy.  I'm so depleted. 9.  OTHER SYMPTOMS: "Do you have any other symptoms?" (e.g., fever, headache, vertigo, vomiting blood or coffee grounds, recent head injury)     I'm dizzy when I move around.   I can't find a comfortable way to lay or sit.   I'm not having a sharp pain.   It's just nausea and bloated and pressure.    10. PREGNANCY: "Is there any chance you are pregnant?" "When was your last menstrual period?"       No  Protocols used: [facility]OM[facility]ING-[facility]-AH

## 2017-05-28 NOTE — ED Provider Notes (Signed)
[facility]-EMERGENCY DEPT Provider Note   CSN: [id number] Arrival date & time: 05/28/17  [id number]     History   Chief Complaint No chief complaint on file.   HPI Brenda Macias is a 47 y.o. female.  HPI   Brenda Macias is a 47 y.o. female, with a history of recurrent nausea, gastritis, and migraines, presenting to the ED with N/V/D beginning last night Last emesis and stool was approximately 2 AM this morning.  Currently complains of nausea and generalized abdominal soreness.  Soreness arose after vomiting began and is "minor." She states she came to the ED because she called her PCPs office and they told her to come here. Has been having epigastric discomfort and nausea after each meal for several months. Has previously had EGD, HIDA scan, and gastric emptying study with no abnormalities found.   Denies fever, hematemesis, urinary complaints, hematochezia/melena, or any other complaints.  Appt with GI tomorrow with Wilmon PaliGuenther, NP.     Past Medical History:  Diagnosis Date  . Endometriosis   . Kidney stones    Cyst and Kidney Stones  . Migraines    pt states onset around the age of 89    Patient Active Problem List   Diagnosis Date Noted  . Gastritis 05/15/2017  . Neck pain 03/07/2017  . Visit for preventive health examination 10/22/2016  . History of abnormal cervical Papanicolaou smear 08/13/2016  . Menopause present 06/27/2016  . History of migraine 06/26/2016    Past Surgical History:  Procedure Laterality Date  . LAPAROSCOPIC VAGINAL HYSTERECTOMY       OB History   None      Home Medications    Prior to Admission medications   Medication Sig Start Date End Date Taking? Authorizing Provider  dexlansoprazole (DEXILANT) 60 MG capsule Take 1 capsule (60 mg total) by mouth daily. 04/01/17   Waldon MerlMartin, William C, PA-C  diclofenac (VOLTAREN) 25 MG EC tablet Take 25 mg by mouth daily as needed. Pt unsure of dosage.     [provider]   gabapentin (NEURONTIN) 100 MG capsule Start with 1 tab po qhs X 1 week, then increase to 1 tab po bid X 1 week then 1 tab po tid prn Patient taking differently: Take 100 mg by mouth at bedtime. Start with 1 tab po qhs X 1 week, then increase to 1 tab po bid X 1 week then 1 tab po tid prn 03/07/17   Andrena Mewsigby, Michael D, DO  NASCOBAL 500 MCG/0.1ML SOLN USE ONE SPRAY ONCE WEEKLY 06/12/16   [provider]  ondansetron (ZOFRAN ODT) 4 MG disintegrating tablet Take 1 tablet (4 mg total) by mouth every 8 (eight) hours as needed for nausea or vomiting. 05/28/17   Joy, Shawn C, PA-C  ranitidine (ZANTAC) 300 MG tablet Take 1 tablet (300 mg total) by mouth at bedtime. 05/15/17   Waldon MerlMartin, William C, PA-C  TROKENDI XR 25 MG CP24 Take 1 capsule by mouth daily. 05/18/16   [provider]  VIVELLE-DOT 0.1 MG/24HR patch APPLY 1 PATCH TWICE WEEKLY 05/28/16   [provider]  zolmitriptan (ZOMIG) 5 MG tablet Take 5 mg by mouth as needed for migraine.    [provider]    Family History Family History  Problem Relation Age of Onset  . Endometriosis Mother   . Hypertension Father   . Kidney cancer Father   . Cancer Father        Hx Thyroid  . Healthy Brother   .  Healthy Brother     Social History Social History   Tobacco Use  . Smoking status: Never Smoker  . Smokeless tobacco: Never Used  Substance Use Topics  . Alcohol use: No  . Drug use: No     Allergies   Penicillins and Rocephin [ceftriaxone]   Review of Systems Review of Systems  Constitutional: Negative for fever.  Respiratory: Negative for shortness of breath.   Cardiovascular: Negative for chest pain.  Gastrointestinal: Positive for abdominal pain, diarrhea, nausea and vomiting. Negative for blood in stool.  Genitourinary: Negative for dysuria, flank pain, frequency, hematuria, vaginal bleeding and vaginal discharge.  Musculoskeletal: Negative for back pain.  Neurological: Negative for dizziness, syncope  and light-headedness.  All other systems reviewed and are negative.    Physical Exam Updated Vital Signs BP 138/84   Pulse (!) 118   Temp (!) 97.5 F (36.4 C)   Resp 18   Ht 5\' 6"  (1.676 m)   Wt 54.4 kg (120 lb)   SpO2 100%   BMI 19.37 kg/m   Physical Exam  Constitutional: She appears well-developed and well-nourished. No distress.  HENT:  Head: Normocephalic and atraumatic.  Eyes: Conjunctivae are normal.  Neck: Neck supple.  Cardiovascular: Normal rate, regular rhythm, normal heart sounds and intact distal pulses.  Pulmonary/Chest: Effort normal and breath sounds normal. No respiratory distress.  Abdominal: Soft. Bowel sounds are normal. There is generalized tenderness. There is no guarding, no CVA tenderness, no tenderness at McBurney's point and negative Murphy's sign.  Patient indicates minor generalized abdominal "soreness" verbally, but has no other reaction.  Musculoskeletal: She exhibits no edema.  Lymphadenopathy:    She has no cervical adenopathy.  Neurological: She is alert.  Skin: Skin is warm and dry. She is not diaphoretic.  Psychiatric: She has a normal mood and affect. Her behavior is normal.  Nursing note and vitals reviewed.    ED Treatments / Results  Labs (all labs ordered are listed, but only abnormal results are displayed) Labs Reviewed  COMPREHENSIVE METABOLIC PANEL - Abnormal; Notable for the following components:      Result Value   Glucose, Bld 120 (*)    Total Protein 8.4 (*)    All other components within normal limits  CBC - Abnormal; Notable for the following components:   WBC 11.2 (*)    MCHC 36.1 (*)    All other components within normal limits  LIPASE, BLOOD  URINALYSIS, ROUTINE W REFLEX MICROSCOPIC    EKG None  Radiology No results found.  Procedures Procedures (including critical care time)  Medications Ordered in ED Medications  ondansetron (ZOFRAN-ODT) disintegrating tablet 4 mg (4 mg Oral Given 05/28/17 1349)      Initial Impression / Assessment and Plan / ED Course  I have reviewed the triage vital signs and the nursing notes.  Pertinent labs & imaging results that were available during my care of the patient were reviewed by me and considered in my medical decision making (see chart for details).     Patient presents with vomiting and diarrhea, resolved for about the past 12 hours. Patient is nontoxic appearing, afebrile, not tachycardic on my exam, not tachypneic, not hypotensive, maintains SPO2 of 100% on room air, and is in no apparent distress.  Discussed options for oral rehydration versus IV fluids.  Patient opted for oral hydration. Lab results encouraging without signs of dehydration or electrolyte disturbance.  Leukocytosis is mild and nonspecific. Able to pass oral fluid challenge.  The patient  was given instructions for home care as well as return precautions. Patient voices understanding of these instructions, accepts the plan, and is comfortable with discharge.   Vitals:   05/28/17 0922 05/28/17 0927 05/28/17 1314  BP: 138/84  117/80  Pulse: (!) 118  93  Resp: 18  18  Temp: (!) 97.5 F (36.4 C)  98.5 F (36.9 C)  TempSrc:   Oral  SpO2: 100%  100%  Weight:  54.4 kg (120 lb)   Height:  5\' 6"  (1.676 m)       Final Clinical Impressions(s) / ED Diagnoses   Final diagnoses:  Nausea, vomiting, and diarrhea    ED Discharge Orders        Ordered    ondansetron (ZOFRAN ODT) 4 MG disintegrating tablet  Every 8 hours PRN     05/28/17 1329       Anselm Pancoast, PA-C 05/28/17 1358    Loren Racer, MD 05/28/17 [phone]

## 2017-05-28 NOTE — ED Triage Notes (Signed)
Patient came to ER today for vomiting diarrhea for 12 hours now consistently. Patient has been having GI issues and is due to see her GI doctor tomorrow. She has been getting nauseous everytime she eats for weeks. Patient has seen her primary care MD for this and he referred her to GI. Patient started taking dexialant which took away the pain but not the nausea. Denies blood in vomit or stool.

## 2017-05-28 NOTE — Discharge Instructions (Addendum)
°  Hand washing: Wash your hands throughout the day, but especially before and after touching the face, using the restroom, sneezing, coughing, or touching surfaces that have been coughed or sneezed upon. Hydration: Symptoms will be intensified and complicated by dehydration. Dehydration can also extend the duration of symptoms. Drink plenty of fluids and get plenty of rest. You should be drinking at least half a liter of water an hour to stay hydrated. Electrolyte drinks (ex. Gatorade, Powerade, Pedialyte) are also encouraged. You should be drinking enough fluids to make your urine light yellow, almost clear. If this is not the case, you are not drinking enough water. Please note that some of the treatments indicated below will not be effective if you are not adequately hydrated. Diet: Please concentrate on hydration, however, you may introduce food slowly.  Start with a clear liquid diet, progressed to a full liquid diet, and then bland solids as you are able. Pain or fever: Ibuprofen, Naproxen, or Tylenol for pain or fever.  Nausea/vomiting: Use the Zofran for nausea or vomiting.  Follow up: Follow-up with the gastroenterologist tomorrow, as planned. Return: Return for persistent vomiting, blood in stool or dark tarry stools, sustained fever over 100.3 F, worsening pain, or any other major concerns.

## 2017-05-29 ENCOUNTER — Ambulatory Visit: Payer: Managed Care, Other (non HMO) | Admitting: Gastroenterology

## 2017-05-29 ENCOUNTER — Encounter: Payer: Self-pay | Admitting: Gastroenterology

## 2017-05-29 VITALS — BP 90/62 | HR 96 | Ht 66.0 in | Wt 121.0 lb

## 2017-05-29 DIAGNOSIS — K2950 Unspecified chronic gastritis without bleeding: Secondary | ICD-10-CM | POA: Diagnosis not present

## 2017-05-29 DIAGNOSIS — K219 Gastro-esophageal reflux disease without esophagitis: Secondary | ICD-10-CM | POA: Diagnosis not present

## 2017-05-29 DIAGNOSIS — R6881 Early satiety: Secondary | ICD-10-CM | POA: Diagnosis not present

## 2017-05-29 DIAGNOSIS — R1013 Epigastric pain: Secondary | ICD-10-CM | POA: Insufficient documentation

## 2017-05-29 MED ORDER — DEXLANSOPRAZOLE 60 MG PO CPDR: 60.0000 mg | DELAYED_RELEASE_CAPSULE | Freq: Every day | ORAL | 5 refills | Status: DC

## 2017-05-29 NOTE — Progress Notes (Signed)
Reviewed and agree with documentation and assessment and plan. K. Veena Loukisha Gunnerson , MD   

## 2017-05-29 NOTE — Patient Instructions (Addendum)
If you are age 49 or older, your body mass index should be between 23-30. Your Body mass index is 19.53 kg/m. If this is out of the aforementioned range listed, please consider follow up with your Primary Care Provider.  If you are age 49 or younger, your body mass index should be between 19-25. Your Body mass index is 19.53 kg/m. If this is out of the aformentioned range listed, please consider follow up with your Primary Care Provider.   You have been scheduled for an endoscopy. Please follow written instructions given to you at your visit today. If you use inhalers (even only as needed), please bring them with you on the day of your procedure. Your physician has requested that you go to www.startemmi.com and enter the access code given to you at your visit today. This web site gives a general overview about your procedure. However, you should still follow specific instructions given to you by our office regarding your preparation for the procedure.  Your Dexilant has been sent to your Pharmacy.  Thank you, [facility]

## 2017-05-29 NOTE — Progress Notes (Signed)
05/29/2017 Brenda Macias [id number] 09/04/1970   HISTORY OF PRESENT ILLNESS:  This is a 47 year old female who is new to our office and was referred here by her PCP, Malva Cogan, PA-C, for complaints of GERD, epigastric abdominal pain, early satiety.  She has history of the same symptoms in the past that had extensive evaluation when she lived in [place], [place].  It appears that she had an EGD and EUS that was normal except for some gastritis in 05/2011.  Gastric and duodenal biopsies were normal.  she also reports having a GES that was borderline normal/abnormal.  She says that all of her symptoms ended up resolving with treatment of Dexilant, which she continued until 2015 or 2016.  She started with the same symptoms again back in November or so.  Complains of heartburn/reflux/indigestion with burning pain up into her esophagus.  Complains of epigastric abdominal pain.  Has a lot of nausea.  She says that she has lost about 7 pounds.  She says that the nausea is very severe after meals, no matter what she eats or puts in her stomach.  She has been back on the Dexilant for the 5 weeks and has noticed some mild improvement.  She has also been taking zantac 300 mg daily at bedtime.  She says that overall the symptoms just seem worse this time around, but are exactly the same as those that she had experienced previously.  Recent CBC, CMP, lipase were normal.  Hpylori Ab negative.  She does take Voltaren prn for her migraines.  Is seeing Dr. Everlena Cooper with neurology tomorrow.   Past Medical History:  Diagnosis Date  . Endometriosis   . Kidney stones    Cyst and Kidney Stones  . Migraines    pt states onset around the age of 41   Past Surgical History:  Procedure Laterality Date  . LAPAROSCOPIC VAGINAL HYSTERECTOMY      reports that she has never smoked. She has never used smokeless tobacco. She reports that she does not drink alcohol or use drugs. family history includes Cystic kidney disease  in her father; Endometriosis in her mother; Healthy in her brother and brother; Hypertension in her father; Kidney cancer in her father; Thyroid cancer in her father. Allergies  Allergen Reactions  . Penicillins Rash    rash  . Rocephin [Ceftriaxone] Rash    Rash      Outpatient Encounter Medications as of 05/29/2017  Medication Sig  . dexlansoprazole (DEXILANT) 60 MG capsule Take 1 capsule (60 mg total) by mouth daily.  . diclofenac (VOLTAREN) 25 MG EC tablet Take 25 mg by mouth daily as needed. Pt unsure of dosage.   . gabapentin (NEURONTIN) 100 MG capsule Start with 1 tab po qhs X 1 week, then increase to 1 tab po bid X 1 week then 1 tab po tid prn (Patient taking differently: Take 100 mg by mouth at bedtime. Start with 1 tab po qhs X 1 week, then increase to 1 tab po bid X 1 week then 1 tab po tid prn)  . NASCOBAL 500 MCG/0.1ML SOLN USE ONE SPRAY ONCE WEEKLY  . ondansetron (ZOFRAN ODT) 4 MG disintegrating tablet Take 1 tablet (4 mg total) by mouth every 8 (eight) hours as needed for nausea or vomiting.  . ranitidine (ZANTAC) 300 MG tablet Take 1 tablet (300 mg total) by mouth at bedtime.  Marland Kitchen TROKENDI XR 25 MG CP24 Take 1 capsule by mouth daily.  Marland Kitchen VIVELLE-DOT 0.1 MG/24HR  patch APPLY 1 PATCH TWICE WEEKLY  . zolmitriptan (ZOMIG) 5 MG tablet Take 5 mg by mouth as needed for migraine.   No facility-administered encounter medications on file as of 05/29/2017.      REVIEW OF SYSTEMS  : All other systems reviewed and negative except where noted in the History of Present Illness.   PHYSICAL EXAM: BP 90/62   Pulse 96   Ht 5\' 6"  (1.676 m)   Wt 121 lb (54.9 kg)   BMI 19.53 kg/m  General: Well developed white female in no acute distress Head: Normocephalic and atraumatic Eyes: Sclerae anicteric, conjunctiva pink. Ears: Normal auditory acuity Lungs: Clear throughout to auscultation; no increased WOB. Heart: Regular rate and rhythm; no M/R/G. Abdomen: Soft, non-distended.  BS present.   Mild epigastric TTP. Musculoskeletal: Symmetrical with no gross deformities  Skin: No lesions on visible extremities Extremities: No edema  Neurological: Alert oriented x 4, grossly non-focal Psychological:  Alert and cooperative. Normal mood and affect  ASSESSMENT AND PLAN: *47 year old female with complaints of GERD, epigastric abdominal pain, early satiety: History of the same symptoms in the past that had extensive evaluation at a different facility and ended up resolving with treatment of Dexilant.  She has been back on the Dexilant for for 5 weeks and has noticed some mild improvement.  She says that overall the symptoms just seem worse this time around, but are exactly the same.  We will schedule for EGD with Dr. Lavon PaganiniNandigam.  She will continue her Dexilant 60 mg daily as well as her Zantac 300 mg at bedtime.  **The risks, benefits, and alternatives to EGD were discussed with the patient and she consents to proceed.   CC:  Waldon MerlMartin, William C, PA-C

## 2017-05-30 ENCOUNTER — Ambulatory Visit: Payer: 59 | Admitting: Neurology

## 2017-05-30 DIAGNOSIS — G43709 Chronic migraine without aura, not intractable, without status migrainosus: Secondary | ICD-10-CM

## 2017-05-30 DIAGNOSIS — IMO0002 Reserved for concepts with insufficient information to code with codable children: Secondary | ICD-10-CM

## 2017-05-30 MED ORDER — ONABOTULINUMTOXINA 100 UNITS IJ SOLR
100.0000 [IU] | Freq: Once | INTRAMUSCULAR | Status: AC
  Administered 2017-05-30: 100 [IU] via INTRAMUSCULAR

## 2017-05-30 NOTE — Progress Notes (Signed)
[facility]   Procedure Note Botox  Attending: Dr. Gyan Cambre  Preoperative Diagnosis(es): Chronic migraine  Consent obtained from: The patient. Benefits discussed included, but were not limited to decreased muscle tightness, increased joint range of motion, and decreased pain.  Risk discussed included, but were not limited pain and discomfort, bleeding, bruising, excessive weakness, venous thrombosis, muscle atrophy and dysphagia.  Anticipated outcomes of the procedure as well as he risks and benefits of the alternatives to the procedure, and the roles and tasks of the personnel to be involved, were discussed with the patient, and the patient consents to the procedure and agrees to proceed. A copy of the patient medication guide was given to the patient which explains the blackbox warning.  Patients identity and treatment sites confirmed:  Yes.  Details of Procedure: Skin was cleaned with alcohol. Prior to injection, the needle plunger was aspirated to make sure the needle was not within a blood vessel.  There was no blood retrieved on aspiration.    Following is a summary of the muscles injected  And the amount of Botulinum toxin used:  Dilution 200 units of Botox was reconstituted with 4 ml of preservative free normal saline. Time of reconstitution: At the time of the office visit (<30 minutes prior to injection)   Injections  155 total units of Botox was injected with a 30 gauge needle.  Injection Sites: L occipitalis: 15 units- 3 sites  R occiptalis: 15 units- 3 sites  L upper trapezius: 15 units- 3 sites R upper trapezius: 15 units- 3 sits          L paraspinal: 10 units- 2 sites R paraspinal: 10 units- 2 sites  Face L frontalis(2 injection sites):10 units   R frontalis(2 injection sites):10 units         L corrugator: 5 units   R corrugator: 5 units           Procerus: 5 units   L temporalis: 20 units R temporalis: 20 units   Agent:  200 units of botulinum Type A  (Onobotulinum Toxin type A) was reconstituted with 4 ml of preservative free normal saline.  Time of reconstitution: At the time of the office visit (<30 minutes prior to injection)     Total injected (Units): 155  Total wasted (Units): 45  Patient tolerated procedure well without complications.   Reinjection is anticipated in 3 months. Return to clinic in 4.5 months.   

## 2017-05-31 ENCOUNTER — Ambulatory Visit (AMBULATORY_SURGERY_CENTER): Payer: Managed Care, Other (non HMO) | Admitting: Gastroenterology

## 2017-05-31 ENCOUNTER — Other Ambulatory Visit: Payer: Self-pay

## 2017-05-31 ENCOUNTER — Encounter: Payer: Self-pay | Admitting: Gastroenterology

## 2017-05-31 VITALS — BP 101/61 | HR 66 | Temp 97.7°F | Resp 10 | Ht 66.0 in | Wt 121.0 lb

## 2017-05-31 DIAGNOSIS — R1013 Epigastric pain: Secondary | ICD-10-CM

## 2017-05-31 DIAGNOSIS — K2970 Gastritis, unspecified, without bleeding: Secondary | ICD-10-CM

## 2017-05-31 MED ORDER — SODIUM CHLORIDE 0.9 % IV SOLN: 500.0000 mL | Freq: Once | INTRAVENOUS | Status: DC

## 2017-05-31 NOTE — Progress Notes (Signed)
Called to room to assist during endoscopic procedure.  Patient ID and intended procedure confirmed with present staff. Received instructions for my participation in the procedure from the performing physician.  

## 2017-05-31 NOTE — Progress Notes (Signed)
To recovery, report to RN, VSS. 

## 2017-05-31 NOTE — Op Note (Addendum)
[facility] Patient Name: Brenda Macias Procedure Date: 05/31/2017 9:04 AM MRN: [id number] Endoscopist: Napoleon Form , MD Age: 47 Referring MD:  Date of Birth: 1971-02-09 Gender: Female Account #: [id number] Procedure:                Upper GI endoscopy Indications:              Epigastric abdominal pain, Dyspepsia Medicines:                Monitored Anesthesia Care Procedure:                Pre-Anesthesia Assessment:                           - Prior to the procedure, a History and Physical                            was performed, and patient medications and                            allergies were reviewed. The patient's tolerance of                            previous anesthesia was also reviewed. The risks                            and benefits of the procedure and the sedation                            options and risks were discussed with the patient.                            All questions were answered, and informed consent                            was obtained. Prior Anticoagulants: The patient has                            taken no previous anticoagulant or antiplatelet                            agents. ASA Grade Assessment: II - A patient with                            mild systemic disease. After reviewing the risks                            and benefits, the patient was deemed in                            satisfactory condition to undergo the procedure.                           After obtaining informed consent, the endoscope was  passed under direct vision. Throughout the                            procedure, the patient's blood pressure, pulse, and                            oxygen saturations were monitored continuously. The                            Endoscope was introduced through the mouth, and                            advanced to the second part of duodenum. The upper                            GI endoscopy  was accomplished without difficulty.                            The patient tolerated the procedure well. Scope In: Scope Out: Findings:                 The Z-line was regular and was found 35 cm from the                            incisors.                           The examined esophagus was normal.                           Patchy mild inflammation characterized by                            congestion (edema) and erythema was found in the                            entire examined stomach. Biopsies were taken with a                            cold forceps for Helicobacter pylori testing.                           Patchy mildly erythematous mucosa without active                            bleeding and with no stigmata of bleeding was found                            in the duodenal bulb.                           The second portion of the duodenum was normal. Complications:            No immediate complications. Estimated Blood Loss:     Estimated blood loss was minimal. Impression:               -  Z-line regular, 35 cm from the incisors.                           - Normal esophagus.                           - Gastritis. Biopsied.                           - Erythematous duodenopathy.                           - Normal second portion of the duodenum. Recommendation:           - Patient has a contact number available for                            emergencies. The signs and symptoms of potential                            delayed complications were discussed with the                            patient. Return to normal activities tomorrow.                            Written discharge instructions were provided to the                            patient.                           - Resume previous diet.                           - Continue present medications.                           - Await pathology results.                           - Perform an abdominal ultrasound to exclude                             gallbladder disease at appointment to be scheduled.                           - No aspirin, ibuprofen, naproxen, or other                            non-steroidal anti-inflammatory drugs. Napoleon Form, MD 05/31/2017 9:44:03 AM This report has been signed electronically.

## 2017-05-31 NOTE — Progress Notes (Signed)
Pt's states no medical or surgical changes since previsit or office visit. 

## 2017-05-31 NOTE — Patient Instructions (Signed)
YOU HAD AN ENDOSCOPIC PROCEDURE TODAY AT THE [facility]:   Refer to the procedure report that was given to you for any specific questions about what was found during the examination.  If the procedure report does not answer your questions, please call your gastroenterologist to clarify.  If you requested that your care partner not be given the details of your procedure findings, then the procedure report has been included in a sealed envelope for you to review at your convenience later.  YOU SHOULD EXPECT: Some feelings of bloating in the abdomen. Passage of more gas than usual.  Walking can help get rid of the air that was put into your GI tract during the procedure and reduce the bloating. If you had a lower endoscopy (such as a colonoscopy or flexible sigmoidoscopy) you may notice spotting of blood in your stool or on the toilet paper. If you underwent a bowel prep for your procedure, you may not have a normal bowel movement for a few days.  Please Note:  You might notice some irritation and congestion in your nose or some drainage.  This is from the oxygen used during your procedure.  There is no need for concern and it should clear up in a day or so.  SYMPTOMS TO REPORT IMMEDIATELY:   Following lower endoscopy (colonoscopy or flexible sigmoidoscopy):  Excessive amounts of blood in the stool  Significant tenderness or worsening of abdominal pains  Swelling of the abdomen that is new, acute  Fever of 100F or higher   Following upper endoscopy (EGD)  Vomiting of blood or coffee ground material  New chest pain or pain under the shoulder blades  Painful or persistently difficult swallowing  New shortness of breath  Fever of 100F or higher  Black, tarry-looking stools  For urgent or emergent issues, a gastroenterologist can be reached at any hour by calling [phone].   DIET:  We do recommend a small meal at first, but then you may proceed to your regular diet.  Drink  plenty of fluids but you should avoid alcoholic beverages for 24 hours.  ACTIVITY:  You should plan to take it easy for the rest of today and you should NOT DRIVE or use heavy machinery until tomorrow (because of the sedation medicines used during the test).    FOLLOW UP: Our staff will call the number listed on your records the next business day following your procedure to check on you and address any questions or concerns that you may have regarding the information given to you following your procedure. If we do not reach you, we will leave a message.  However, if you are feeling well and you are not experiencing any problems, there is no need to return our call.  We will assume that you have returned to your regular daily activities without incident.  If any biopsies were taken you will be contacted by phone or by letter within the next 1-3 weeks.  Please call us at [phone] if you have not heard about the biopsies in 3 weeks.    SIGNATURES/CONFIDENTIALITY: You and/or your care partner have signed paperwork which will be entered into your electronic medical record.  These signatures attest to the fact that that the information above on your After Visit Summary has been reviewed and is understood.  Full responsibility of the confidentiality of this discharge information lies with you and/or your care-partner.  Avoid non steroidal ant-iinflammatory medications.  Ultra sound abdomen.  April  10th (Wednesday) at 0645.  [facility]. Nothing to eat or drink after midnight.  You may brush your teeth, Do not swallow water.

## 2017-06-03 ENCOUNTER — Telehealth: Payer: Self-pay

## 2017-06-03 NOTE — Telephone Encounter (Signed)
  Follow up Call-  Call back number 05/31/2017  Post procedure Call Back phone  # [phone]  Permission to leave phone message Yes  Some recent data might be hidden     Patient questions:  Do you have a fever, pain , or abdominal swelling? No. Pain Score  0 *  Have you tolerated food without any problems? Yes.    Have you been able to return to your normal activities? Yes.    Do you have any questions about your discharge instructions: Diet   No. Medications  No. Follow up visit  No.  Do you have questions or concerns about your Care? No.  Actions: * If pain score is 4 or above: No action needed, pain <4.

## 2017-06-05 ENCOUNTER — Ambulatory Visit (HOSPITAL_COMMUNITY): Payer: Managed Care, Other (non HMO)

## 2017-06-07 ENCOUNTER — Ambulatory Visit: Payer: Managed Care, Other (non HMO) | Admitting: Sports Medicine

## 2017-06-11 ENCOUNTER — Encounter: Payer: Self-pay | Admitting: Sports Medicine

## 2017-06-11 ENCOUNTER — Ambulatory Visit: Payer: Managed Care, Other (non HMO) | Admitting: Sports Medicine

## 2017-06-11 VITALS — BP 102/70 | HR 83 | Ht 66.0 in | Wt 122.6 lb

## 2017-06-11 DIAGNOSIS — Z8669 Personal history of other diseases of the nervous system and sense organs: Secondary | ICD-10-CM | POA: Diagnosis not present

## 2017-06-11 DIAGNOSIS — M542 Cervicalgia: Secondary | ICD-10-CM | POA: Diagnosis not present

## 2017-06-11 DIAGNOSIS — M9901 Segmental and somatic dysfunction of cervical region: Secondary | ICD-10-CM

## 2017-06-11 DIAGNOSIS — M9908 Segmental and somatic dysfunction of rib cage: Secondary | ICD-10-CM | POA: Diagnosis not present

## 2017-06-11 DIAGNOSIS — M9902 Segmental and somatic dysfunction of thoracic region: Secondary | ICD-10-CM | POA: Diagnosis not present

## 2017-06-11 NOTE — Progress Notes (Signed)
Brenda Macias. [name] at [facility] [phone]  Brenda Macias - 47 y.o. female MRN [id number]  Date of birth: 01-Mar-1971  Visit Date: 06/11/2017  PCP: Waldon Merl, PA-C   Referred by: Waldon Merl, PA-C  Scribe for today's visit: Christoper Fabian, LAT, ATC     SUBJECTIVE:  Brenda Arnt "[name]" is here for Follow-up (neck pain and migraines) .   03/07/17: Her LT-sided neck and shoulder pain symptoms INITIALLY: Began about 2 months ago and the is no known MOI.  Neck pain: Described as mild stabbing pain, radiating to the upper back, shoulder, and neck.  Worsened with turning head to the LT or RT, LT > RT. Pain is worse when lying down at night.  Improved with rest. She has been seen by a chiropractor in the past, years ago, and got some relief of pain associated with migraine HA.  Additional associated symptoms include: LT shoulder pain At this time symptoms are worsening compared to onset, severity is getting worse over time.  LT shoulder: Described as moderate-severe aching at rest but shooting stabbing pain with movement.  Worsened with movement.  Improved with rest.  Additional associated symptoms include: LT-sided neck pain.     05/14/17: Compared to the last office visit, her previously described symptoms are improving, 1/10 Current symptoms are mild & are nonradiating She has been taking Gabapentin 100 mg aPM. She is experiencing increased fatigue but its manageable.  She received OMT 04/16/17. Still has slight pain when turning head side-to-side, it had completely resolved but returned over the past few days.   06/11/17: Compared to the last office visit on 05/14/17, her previously described neck pain symptoms are improving w/ no pain noted and improved cervical ROM. Current symptoms are none-mild & are nonradiating She has not been taking Gabapentin due to being ill recently and not being able to hold food down.   She received OMT on her last visit (05/14/17).  She has been doing her [employer] and states that they have been going well.  She has not done them over the past 1-2 weeks due to being ill.  ROS Denies night time disturbances. Denies fevers, chills, or night sweats. Denies unexplained weight loss. Denies personal history of cancer. Denies changes in bowel or bladder habits. Denies recent unreported falls. Denies new or worsening dyspnea or wheezing. Reports headaches or dizziness.   Yes to migraines. Denies numbness, tingling or weakness  In the extremities.  Denies dizziness or presyncopal episodes Denies lower extremity edema    HISTORY & PERTINENT PRIOR DATA:  Prior History reviewed and updated per electronic medical record.  Significant/pertinent history, findings, studies include:  reports that she has never smoked. She has never used smokeless tobacco. Recent Labs    10/22/16 1110  HGBA1C 5.0   No specialty comments available. No problems updated.  OBJECTIVE:  VS:  HT:5\' 6"  (167.6 cm)   WT:122 lb 9.6 oz (55.6 kg)  BMI:19.8    BP:102/70  HR:83bpm  TEMP: ( )  RESP:98 %   PHYSICAL EXAM: Constitutional: WDWN, Non-toxic appearing. Psychiatric: Alert & appropriately interactive.  Not depressed or anxious appearing. Respiratory: No increased work of breathing.  Trachea Midline Eyes: Pupils are equal.  EOM intact without nystagmus.  No scleral icterus  Vascular Exam: warm to touch no edema  upper extremity neuro exam: unremarkable normal strength normal sensation normal reflexes  MSK Exam: Limited side bending bilaterally   ASSESSMENT &  PLAN:  No diagnosis found.  PLAN: Osteopathic manipulation was performed today based on physical exam findings.  Please see procedure note for further information including Osteopathic Exam findings  Overall she is doing quite well.  PROCEDURE NOTE : OSTEOPATHIC MANIPULATION The decision today to treat with Osteopathic  Manipulative Therapy (OMT) was based on physical exam findings. Verbal consent was obtained following a discussion with the patient regarding the of risks, benefits and potential side effects, including an acute pain flare,post manipulation soreness and need for repeat treatments.     Contraindications to OMT reviewed and include: NONE  Manipulation was performed as below: Regions treated: Cervical spine, Ribs and Thoracic spine OMT Techniques Used: HVLA, muscle energy and myofascial release  The patient tolerated the treatment well and reported Improved symptoms following treatment today. Patient was given medications, exercises, stretches and lifestyle modifications per AVS and verbally.   OSTEOPATHIC/STRUCTURAL EXAM:   AA rotated right C2-C4 FRS right Elevated right 1st rib T2-T5 [id number]   Follow-up: 4 6 weeks for repeat clinical exam and consideration of repeat osteopathic manipulation.     Please see additional documentation for Objective, Assessment and Plan sections. Pertinent additional documentation may be included in corresponding procedure notes, imaging studies, problem based documentation and patient instructions. Please see these sections of the encounter for additional information regarding this visit.  CMA/ATC served as Neurosurgeonscribe during this visit. History, Physical, and Plan performed by medical provider. Documentation and orders reviewed and attested to.      Andrena MewsMichael D Rigby, DO    [facility]

## 2017-06-12 ENCOUNTER — Ambulatory Visit (HOSPITAL_COMMUNITY)
Admission: RE | Admit: 2017-06-12 | Discharge: 2017-06-12 | Disposition: A | Discharge status: Home / Self Care | Payer: Managed Care, Other (non HMO) | Source: Ambulatory Visit | Attending: Gastroenterology | Admitting: Gastroenterology

## 2017-06-12 DIAGNOSIS — R1013 Epigastric pain: Secondary | ICD-10-CM | POA: Diagnosis not present

## 2017-06-13 ENCOUNTER — Encounter: Payer: Self-pay | Admitting: Gastroenterology

## 2017-06-17 ENCOUNTER — Encounter: Payer: Self-pay | Admitting: Gastroenterology

## 2017-07-10 NOTE — Progress Notes (Addendum)
J8119 Botox apprvd 200u injected Q 41mo for 1 yr  07/08/17-07/09/18  PA# [id number]    [id number] PA#[id number] 07/08/17-07/09/18

## 2017-07-22 ENCOUNTER — Ambulatory Visit: Payer: Managed Care, Other (non HMO) | Admitting: Sports Medicine

## 2017-07-22 ENCOUNTER — Other Ambulatory Visit: Payer: Self-pay | Admitting: Neurology

## 2017-07-24 ENCOUNTER — Ambulatory Visit: Payer: Managed Care, Other (non HMO) | Admitting: Sports Medicine

## 2017-07-24 ENCOUNTER — Encounter: Payer: Self-pay | Admitting: Sports Medicine

## 2017-07-24 VITALS — BP 108/78 | HR 86 | Ht 66.0 in | Wt 123.2 lb

## 2017-07-24 DIAGNOSIS — M542 Cervicalgia: Secondary | ICD-10-CM

## 2017-07-24 DIAGNOSIS — M9902 Segmental and somatic dysfunction of thoracic region: Secondary | ICD-10-CM

## 2017-07-24 DIAGNOSIS — M9901 Segmental and somatic dysfunction of cervical region: Secondary | ICD-10-CM | POA: Diagnosis not present

## 2017-07-24 DIAGNOSIS — M9908 Segmental and somatic dysfunction of rib cage: Secondary | ICD-10-CM

## 2017-07-24 DIAGNOSIS — Z8669 Personal history of other diseases of the nervous system and sense organs: Secondary | ICD-10-CM

## 2017-07-24 NOTE — Progress Notes (Signed)
Brenda Macias. Brenda Macias Sports Medicine [facility] at [facility] [phone]  Federica Allport - 47 y.o. female MRN [id number]  Date of birth: [birth year]  Visit Date: 07/24/2017  PCP: Waldon Merl, PA-C   Referred by: Waldon Merl, PA-C  Scribe for today's visit: Christoper Fabian, LAT, ATC     SUBJECTIVE:  Brenda Macias "[name]" is here for Follow-up (neck pain and migraines) .   03/07/17: Her LT-sided neck and shoulder pain symptoms INITIALLY: Began about 2 months ago and the is no known MOI.  Neck pain: Described as mild stabbing pain, radiating to the upper back, shoulder, and neck.  Worsened with turning head to the LT or RT, LT > RT. Pain is worse when lying down at night.  Improved with rest. She has been seen by a chiropractor in the past, years ago, and got some relief of pain associated with migraine HA.  Additional associated symptoms include: LT shoulder pain At this time symptoms are worsening compared to onset, severity is getting worse over time.  LT shoulder: Described as moderate-severe aching at rest but shooting stabbing pain with movement.  Worsened with movement.  Improved with rest.  Additional associated symptoms include: LT-sided neck pain.     05/14/17: Compared to the last office visit, her previously described symptoms are improving, 1/10 Current symptoms are mild & are nonradiating She has been taking Gabapentin 100 mg aPM. She is experiencing increased fatigue but its manageable.  She received OMT 04/16/17. Still has slight pain when turning head side-to-side, it had completely resolved but returned over the past few days.   06/11/17: Compared to the last office visit on 05/14/17, her previously described neck pain symptoms are improving w/ no pain noted and improved cervical ROM. Current symptoms are none-mild & are nonradiating She has not been taking Gabapentin due to being ill recently and not being able to hold food down.   She received OMT on her last visit (05/14/17).  She has been doing her [employer] and states that they have been going well.  She has not done them over the past 1-2 weeks due to being ill.  07/24/2017: Compared to the last office visit on 06/11/17, her previously described neck symptoms are improving but feels like something's "off" because she's been waking up w/ headaches, almost to the point of a migraine.  The HA will then subside w/in approximately one hour of waking.  She also notes a "knot" in her R scapula. Current symptoms are mild & are nonradiating She is no longer taking the Gabapentin.  She is still doing the [employer] but not consistently.  ROS Denies night time disturbances. Denies fevers, chills, or night sweats. Denies unexplained weight loss. Denies personal history of cancer. Denies changes in bowel or bladder habits. Denies recent unreported falls. Denies new or worsening dyspnea or wheezing. Reports headaches or dizziness.  Migraines Denies numbness, tingling or weakness  In the extremities.  Denies dizziness or presyncopal episodes Denies lower extremity edema    HISTORY & PERTINENT PRIOR DATA:  Prior History reviewed and updated per electronic medical record.  Significant/pertinent history, findings, studies include:  reports that she has never smoked. She has never used smokeless tobacco. Recent Labs    10/22/16 1110  HGBA1C 5.0   No specialty comments available. No problems updated.  OBJECTIVE:  VS:  HT:5\' 6"  (167.6 cm)   WT:123 lb 3.2 oz (55.9 kg)  BMI:19.89    BP:108/78  HR:86bpm  TEMP: ( )  RESP:99 %   PHYSICAL EXAM: Constitutional: WDWN, Non-toxic appearing. Psychiatric: Alert & appropriately interactive.  Not depressed or anxious appearing. Respiratory: No increased work of breathing.  Trachea Midline Eyes: Pupils are equal.  EOM intact without nystagmus.  No scleral icterus  Vascular Exam: warm to touch no edema  upper  extremity neuro exam: unremarkable  MSK Exam: Negative Spurling's compression test and Lhermitte's compression test.  No significant exacerbation of her migraine symptoms with osteopathic manipulation today.  Persistently tight right paraspinal cervical muscles.   ASSESSMENT & PLAN:   1. Somatic dysfunction of rib cage region   2. Somatic dysfunction of thoracic region   3. Somatic dysfunction of cervical region   4. Neck pain   5. History of migraine     PLAN: Overall significant improvements and only one migraine since our last visit.  I do think she is responding well to the Botox injections that she is receiving.  We will plan to follow-up with her in 6 weeks and check on her progress at that time and anticipate being able to continue to space her out further and further but she is responding favorably to the osteopathic manipulation.  Continue HEP (home exercise program)  Follow-up: Return in about 6 weeks (around 09/04/2017).      Please see additional documentation for Objective, Assessment and Plan sections. Pertinent additional documentation may be included in corresponding procedure notes, imaging studies, problem based documentation and patient instructions. Please see these sections of the encounter for additional information regarding this visit.  CMA/ATC served as Neurosurgeon during this visit. History, Physical, and Plan performed by medical provider. Documentation and orders reviewed and attested to.      [name], DO     Sports Medicine Physician

## 2017-07-24 NOTE — Progress Notes (Signed)
PROCEDURE NOTE : OSTEOPATHIC MANIPULATION The decision today to treat with Osteopathic Manipulative Therapy (OMT) was based on physical exam findings. Verbal consent was obtained following a discussion with the patient regarding the of risks, benefits and potential side effects, including an acute pain flare,post manipulation soreness and need for repeat treatments.     Contraindications to OMT reviewed and include: NONE  Manipulation was performed as below: Regions treated: Cervical spine, Ribs and Thoracic spine OMT Techniques Used: HVLA, muscle energy and myofascial release  The patient tolerated the treatment well and reported Improved symptoms following treatment today. Patient was given medications, exercises, stretches and lifestyle modifications per AVS and verbally.   OSTEOPATHIC/STRUCTURAL EXAM:   Significant improvements and paraspinal muscle spasms. OA rotated right C2-C4 rotated left Posterior rib 6 on the right T2-T4 neutral rotated left, side bent right

## 2017-08-04 ENCOUNTER — Encounter: Payer: Self-pay | Admitting: Sports Medicine

## 2017-08-05 ENCOUNTER — Other Ambulatory Visit: Payer: Self-pay | Admitting: Physician Assistant

## 2017-08-06 ENCOUNTER — Telehealth: Payer: Self-pay | Admitting: Neurology

## 2017-08-06 ENCOUNTER — Other Ambulatory Visit: Payer: Self-pay

## 2017-08-06 DIAGNOSIS — G43009 Migraine without aura, not intractable, without status migrainosus: Secondary | ICD-10-CM

## 2017-08-06 MED ORDER — TOPIRAMATE ER 25 MG PO CAP24: 25.0000 mg | ORAL_CAPSULE | Freq: Every day | ORAL | 6 refills | Status: DC

## 2017-08-06 NOTE — Telephone Encounter (Signed)
Patient called because her pharmacy would not fill her Trokendi medication that she was out of refills? [facility] uses [facility] [address] in [place]. Please Call. Thanks

## 2017-08-06 NOTE — Telephone Encounter (Signed)
[facility] [place]

## 2017-08-06 NOTE — Telephone Encounter (Signed)
LMOVM advising Pt Rx sent in.  Pt called back immediately, did not listen to VM. Advised her Rx sent in.

## 2017-08-07 ENCOUNTER — Encounter: Payer: Self-pay | Admitting: Neurology

## 2017-08-07 NOTE — Progress Notes (Addendum)
Initiated on cover my meds key: [id number]  If no response w/in 72 hrs call: [employer] at [phone]  Rcvd denial from [place]

## 2017-08-16 ENCOUNTER — Encounter: Payer: Self-pay | Admitting: Neurology

## 2017-08-22 ENCOUNTER — Telehealth: Payer: Self-pay

## 2017-08-22 NOTE — Progress Notes (Addendum)
Called [employer] 08/15/17 spoke with Maxine GlennMonica, started an appeal. Was to receive determination within 72 hours.  Called [employer] again today, spoke with [place]Brianna. I advised her I wanted to know why was denied now, since this is a continuation of therapy. She advised me, [employer] Auerbach[employer]ign[employer] has not ever paid for Trokendi. The first time it was sent to them was 08/2017.  Called Pt, LMOVM asking her to call me back to see if she was given manufacturer copay card, and that is how she has been filling Rx.  Pt called back. She said insurance plan did change in May 2019, but only copays as far as she knows. She called [employer] after the change to find out what medications she may need PA's for with the change in the paln, she was told Botox was the only one; however, [employer] did not have record of any of her medications being filed/paid through them. Pt is to call HR with her husbands company and ask for their assistance. She will let me know the outcome next Thurs when she comes for Botox.

## 2017-08-22 NOTE — Telephone Encounter (Signed)
Brenda BondCalled [employer] Rx delivery [phone] to set up Botox delivery. Rx had expired, spoke with Amy, gave verbal with R3. Advised delivery needs to be by 08/28/17

## 2017-08-23 ENCOUNTER — Ambulatory Visit: Payer: Managed Care, Other (non HMO) | Admitting: Neurology

## 2017-08-27 NOTE — Telephone Encounter (Signed)
Botox rcvd 

## 2017-08-29 ENCOUNTER — Ambulatory Visit (INDEPENDENT_AMBULATORY_CARE_PROVIDER_SITE_OTHER): Payer: Managed Care, Other (non HMO) | Admitting: Neurology

## 2017-08-29 DIAGNOSIS — G43709 Chronic migraine without aura, not intractable, without status migrainosus: Secondary | ICD-10-CM | POA: Diagnosis not present

## 2017-08-29 DIAGNOSIS — IMO0002 Reserved for concepts with insufficient information to code with codable children: Secondary | ICD-10-CM

## 2017-08-29 MED ORDER — ONABOTULINUMTOXINA 100 UNITS IJ SOLR
155.0000 [IU] | Freq: Once | INTRAMUSCULAR | Status: AC
  Administered 2017-08-29: 155 [IU] via INTRAMUSCULAR

## 2017-08-29 NOTE — Progress Notes (Signed)
[facility]   Procedure Note Botox  Attending: Dr. Jaffe  Preoperative Diagnosis(es): Chronic migraine  Consent obtained from: The patient Benefits discussed included, but were not limited to decreased muscle tightness, increased joint range of motion, and decreased pain.  Risk discussed included, but were not limited pain and discomfort, bleeding, bruising, excessive weakness, venous thrombosis, muscle atrophy and dysphagia.  Anticipated outcomes of the procedure as well as he risks and benefits of the alternatives to the procedure, and the roles and tasks of the personnel to be involved, were discussed with the patient, and the patient consents to the procedure and agrees to proceed. A copy of the patient medication guide was given to the patient which explains the blackbox warning.  Patients identity and treatment sites confirmed Yes.  .  Details of Procedure: Skin was cleaned with alcohol. Prior to injection, the needle plunger was aspirated to make sure the needle was not within a blood vessel.  There was no blood retrieved on aspiration.    Following is a summary of the muscles injected  And the amount of Botulinum toxin used:  Dilution 200 units of Botox was reconstituted with 4 ml of preservative free normal saline. Time of reconstitution: At the time of the office visit (<30 minutes prior to injection)   Injections  155 total units of Botox was injected with a 30 gauge needle.  Injection Sites: L occipitalis: 15 units- 3 sites  R occiptalis: 15 units- 3 sites  L upper trapezius: 15 units- 3 sites R upper trapezius: 15 units- 3 sits          L paraspinal: 10 units- 2 sites R paraspinal: 10 units- 2 sites  Face L frontalis(2 injection sites):10 units   R frontalis(2 injection sites):10 units         L corrugator: 5 units   R corrugator: 5 units           Procerus: 5 units   L temporalis: 20 units R temporalis: 20 units   Agent:  200 units of botulinum Type A  (Onobotulinum Toxin type A) was reconstituted with 4 ml of preservative free normal saline.  Time of reconstitution: At the time of the office visit (<30 minutes prior to injection)     Total injected (Units): 155  Total wasted (Units): none wasted  Patient tolerated procedure well without complications.   Reinjection is anticipated in 3 months.    

## 2017-09-04 ENCOUNTER — Ambulatory Visit: Payer: Managed Care, Other (non HMO) | Admitting: Sports Medicine

## 2017-09-10 ENCOUNTER — Encounter: Payer: Self-pay | Admitting: Sports Medicine

## 2017-09-10 ENCOUNTER — Ambulatory Visit: Payer: Managed Care, Other (non HMO) | Admitting: Sports Medicine

## 2017-09-10 VITALS — BP 102/76 | HR 84 | Ht 66.0 in | Wt 124.2 lb

## 2017-09-10 DIAGNOSIS — M542 Cervicalgia: Secondary | ICD-10-CM | POA: Diagnosis not present

## 2017-09-10 DIAGNOSIS — Z8669 Personal history of other diseases of the nervous system and sense organs: Secondary | ICD-10-CM | POA: Diagnosis not present

## 2017-09-10 NOTE — Progress Notes (Signed)
[name]. [name] Sports Medicine [facility] at [facility] [phone]  Brenda Macias - 47 y.o. female MRN [id number]  Date of birth: 01-May-1970  Visit Date: 09/10/2017  PCP: Waldon Merl, PA-C   Referred by: Waldon Merl, PA-C  Scribe(s) for today's visit: Stevenson Clinch, CMA  SUBJECTIVE:  Brenda Arnt "[name]" is here for Follow-up (neck pain)   03/07/17: Her LT-sided neck and shoulder pain symptoms INITIALLY: Began about 2 months ago and the is no known MOI.  Neck pain: Described as mild stabbing pain, radiating to the upper back, shoulder, and neck.  Worsened with turning head to the LT or RT, LT > RT. Pain is worse when lying down at night.  Improved with rest. She has been seen by a chiropractor in the past, years ago, and got some relief of pain associated with migraine HA.  Additional associated symptoms include: LT shoulder pain At this time symptoms are worsening compared to onset, severity is getting worse over time.  LT shoulder: Described as moderate-severe aching at rest but shooting stabbing pain with movement.  Worsened with movement.  Improved with rest.  Additional associated symptoms include: LT-sided neck pain.     05/14/17: Compared to the last office visit, her previously described symptoms are improving, 1/10 Current symptoms are mild & are nonradiating She has been taking Gabapentin 100 mg aPM. She is experiencing increased fatigue but its manageable.  She received OMT 04/16/17. Still has slight pain when turning head side-to-side, it had completely resolved but returned over the past few days.   06/11/17: Compared to the last office visit on 05/14/17, her previously described neck pain symptoms are improving w/ no pain noted and improved cervical ROM. Current symptoms are none-mild & are nonradiating She has not been taking Gabapentin due to being ill recently and not being able to hold food down.  She received OMT  on her last visit (05/14/17).  She has been doing her [employer] and states that they have been going well.  She has not done them over the past 1-2 weeks due to being ill.  07/24/2017: Compared to the last office visit on 06/11/17, her previously described neck symptoms are improving but feels like something's "off" because she's been waking up w/ headaches, almost to the point of a migraine.  The HA will then subside w/in approximately one hour of waking.  She also notes a "knot" in her R scapula. Current symptoms are mild & are nonradiating She is no longer taking the Gabapentin.  She is still doing the [employer] but not consistently.  09/10/2017: Compared to the last office visit, her previously described symptoms are improving, she reports minimal to no pain.  Current symptoms are mild & are non-radiating. She reports that the knot near her scapula has gone away.  She has been doing Goodman's exercises intermittently.    REVIEW OF SYSTEMS: Denies night time disturbances. Denies fevers, chills, or night sweats. Denies unexplained weight loss. Denies personal history of cancer. Denies changes in bowel or bladder habits. Denies recent unreported falls. Denies new or worsening dyspnea or wheezing. Reports headaches.  Denies numbness, tingling or weakness  In the extremities.  Denies dizziness or presyncopal episodes Denies lower extremity edema    HISTORY & PERTINENT PRIOR DATA:  Prior History reviewed and updated per electronic medical record.  Significant/pertinent history, findings, studies include:  reports that she has never smoked. She has never used smokeless tobacco. Recent Labs  10/22/16 1110  HGBA1C 5.0   No specialty comments available. No problems updated.  OBJECTIVE:  VS:  HT:5\' 6"  (167.6 cm)   WT:124 lb 3.2 oz (56.3 kg)  BMI:20.06    BP:102/76  HR:84bpm  TEMP: ( )  RESP:98 %   PHYSICAL EXAM: Constitutional: WDWN, Non-toxic  appearing. Psychiatric: Alert & appropriately interactive.  Not depressed or anxious appearing. Respiratory: No increased work of breathing.  Trachea Midline Eyes: Pupils are equal.  EOM intact without nystagmus.  No scleral icterus  Vascular Exam: warm to touch no edema  upper extremity neuro exam: unremarkable normal strength normal sensation  MSK Exam: Overall she is sitting in a comfortable position with a neutral cervical spine.  She has somewhat limited side bending to the right and tightness of the left scalenes and levator scapula.  Negative Spurling's compression test.  Overall good mobility of the neck otherwise.   ASSESSMENT & PLAN:   1. Neck pain   2. History of migraine     PLAN: She is doing remarkably better following her latest Botox injections and with her home exercises.  I emphasized the importance of continuing with her previously prescribed HEP the majority of the days otherwise there is a risk of this recurring.  She will follow-up with me on an as-needed basis and OMT was deferred today given the fact she is feeling as good as she is.  Follow-up: Return if symptoms worsen or fail to improve.      Please see additional documentation for Objective, Assessment and Plan sections. Pertinent additional documentation may be included in corresponding procedure notes, imaging studies, problem based documentation and patient instructions. Please see these sections of the encounter for additional information regarding this visit.  CMA/ATC served as Neurosurgeonscribe during this visit. History, Physical, and Plan performed by medical provider. Documentation and orders reviewed and attested to.      Brenda MewsMichael D Akera Snowberger, DO    [place] Sports Medicine Physician

## 2017-10-08 ENCOUNTER — Other Ambulatory Visit: Payer: Self-pay

## 2017-10-08 ENCOUNTER — Encounter: Payer: Self-pay | Admitting: Neurology

## 2017-10-08 ENCOUNTER — Ambulatory Visit: Payer: Managed Care, Other (non HMO) | Admitting: Neurology

## 2017-10-08 VITALS — BP 118/76 | HR 91 | Ht 65.0 in | Wt 125.0 lb

## 2017-10-08 DIAGNOSIS — G43009 Migraine without aura, not intractable, without status migrainosus: Secondary | ICD-10-CM | POA: Diagnosis not present

## 2017-10-08 MED ORDER — PREDNISONE 10 MG PO TABS: ORAL_TABLET | ORAL | 0 refills | Status: DC

## 2017-10-08 NOTE — Progress Notes (Signed)
NEUROLOGY FOLLOW UP OFFICE NOTE  [name] [id number]  HISTORY OF PRESENT ILLNESS: Brenda Macias is a 47 year old female who follows up for migraines.     UPDATE: Headaches have been well-controlled until a week and a half ago.  At that time, she returned from a trip where she was eating poorly ([employer]).  She thinks her diet and the change of weather when she returned has triggered these headaches.  They have been daily but not severe.  Prior to then, they were moderate, 2 hours duration and occurring 3 to 4 days a week (once a month a migraine)  In April, she woke up with eye redness and sensation in her right eye.  Since then, she has had a constant floater in the vision of her right eye.  She is being referred to a retinal specialist for evaluation of detachment.  Current NSAIDS:  diclofenac ER 100mg  daily PRN Current analgesics:  Fioricet (only for severe tension headaches) Current triptans:  Zomig ZMT 5mg , Zembrace SymTouch (has not used) Current anti-emetic:  no Current muscle relaxants:  no Current anti-anxiolytic:  no Current sleep aide:  no Current Antihypertensive medications:  no Current Antidepressant medications:  no Current Anticonvulsant medications:  Trokendi XR 25mg  (50mg  causes drowsiness), gabapentin 100mg  at bedtime Current Vitamins/Herbal/Supplements:  B12 Current Antihistamines/Decongestants:  no Other therapy:  Botox, OMM for neck pain   Caffeine:  1 coke/day, green tea Alcohol:  no Smoker:  no Diet:  Hydrates.  Follows strict diet Exercise:  yes Depression/anxiety:  no Sleep hygiene:  good   HISTORY: Onset:  childhood Location:  Frontal or unilateral either side Quality:  stabbing Initial Intensity:  10/10 Aura:  Flashing blue lights and floaters in vision Prodrome:  Fatigue, hunger, irritability Postdrome:  fatigue Associated symptoms:  Photophobia, phonophobia, osmophobia.  Rarely nausea.  She has not had any new worse headache of her  life, waking up from sleep Initial Duration:  1 to 3 days (no more than 2 hours with Zomig except for when she wakes up with migraine, in which they last longer) Initial Frequency: Prior to Botox, almost daily.  Since Botox, 1 migraine day a month and total of 4 or 5 headache days a month. Triggers/exacerbating factors:  Cigarette smoke, perfumes, cheese, citrus, sleep deprivation, too much sleep, menstrual cycle, change in barometric pressure. Relieving factors:  Water, sleep, rest Activity:  Aggravates She also has bi-temporal non-throbbing tension type headaches.  They have been daily since missing her last Botox in March ([place]).   Past NSAIDS:  ibuprofen, naproxen Past analgesics:  acetaminophen, Excedrin Past abortive triptans:  Zecuity patch, Maxalt (ineffective), Relpax (side effects) Past muscle relaxants:  Robaxin Past anti-emetic:  no Past antihypertensive medications:  No.  Propranolol not good option due to hypotension. Past antidepressant medications:  Cannot recall Past anticonvulsant medications:  none Other past therapies:  no   Family history of headache:  Mother, brother   MRI of brain without contrast from 11/02/13 was personally reviewed and demonstrated minimal and subtle foci in the left periventricular and parietal subcortical white matter.  A follow up MRI of brain with contrast was performed on 11/13/13, which revealed no abnormal enhancement.  PAST MEDICAL HISTORY: Past Medical History:  Diagnosis Date  . Endometriosis   . Kidney stones    Cyst and Kidney Stones  . Migraines    pt states onset around the age of 57    MEDICATIONS: Current Outpatient Medications on File  Prior to Visit  Medication Sig Dispense Refill  . dexlansoprazole (DEXILANT) 60 MG capsule Take 1 capsule (60 mg total) by mouth daily. 30 capsule 5  . diclofenac (VOLTAREN) 25 MG EC tablet Take 25 mg by mouth daily as needed. Pt unsure of dosage.     Marland Kitchen NASCOBAL 500  MCG/0.1ML SOLN USE ONE SPRAY ONCE WEEKLY  11  . ondansetron (ZOFRAN ODT) 4 MG disintegrating tablet Take 1 tablet (4 mg total) by mouth every 8 (eight) hours as needed for nausea or vomiting. 20 tablet 0  . ranitidine (ZANTAC) 300 MG tablet TAKE 1 TABLET BY MOUTH EVERYDAY AT BEDTIME 30 tablet 2  . Topiramate ER (TROKENDI XR) 25 MG CP24 Take 25 mg by mouth daily. 30 capsule 6  . TROKENDI XR 25 MG CP24 Take 1 capsule by mouth daily.  3  . VIVELLE-DOT 0.1 MG/24HR patch APPLY 1 PATCH TWICE WEEKLY  0  . zolmitriptan (ZOMIG-ZMT) 5 MG disintegrating tablet TAKE 1 TABLET ONCE A DAY AS NEEDED (Patient taking differently: as needed) 10 tablet 1   Current Facility-Administered Medications on File Prior to Visit  Medication Dose Route Frequency Provider Last Rate Last Dose  . 0.9 %  sodium chloride infusion  500 mL Intravenous Once Nandigam, Eleonore Chiquito, MD        ALLERGIES: Allergies  Allergen Reactions  . Penicillins Rash    rash  . Rocephin [Ceftriaxone] Rash    Rash    FAMILY HISTORY: Family History  Problem Relation Age of Onset  . Endometriosis Mother   . Hypertension Father   . Kidney cancer Father   . Thyroid cancer Father   . Cystic kidney disease Father   . Healthy Brother   . Healthy Brother   . Stomach cancer Neg Hx   . Colon cancer Neg Hx     SOCIAL HISTORY: Social History   Socioeconomic History  . Marital status: Married    Spouse name: Not on file  . Number of children: Not on file  . Years of education: Not on file  . Highest education level: Not on file  Occupational History  . Not on file  Social Needs  . Financial resource strain: Not on file  . Food insecurity:    Worry: Not on file    Inability: Not on file  . Transportation needs:    Medical: Not on file    Non-medical: Not on file  Tobacco Use  . Smoking status: Never Smoker  . Smokeless tobacco: Never Used  Substance and Sexual Activity  . Alcohol use: No  . Drug use: No  . Sexual activity: Yes      Partners: Male    Birth control/protection: Surgical    Comment: Hysterectomy  Lifestyle  . Physical activity:    Days per week: Not on file    Minutes per session: Not on file  . Stress: Not on file  Relationships  . Social connections:    Talks on phone: Not on file    Gets together: Not on file    Attends religious service: Not on file    Active member of club or organization: Not on file    Attends meetings of clubs or organizations: Not on file    Relationship status: Not on file  . Intimate partner violence:    Fear of current or ex partner: Not on file    Emotionally abused: Not on file    Physically abused: Not on file  Forced sexual activity: Not on file  Other Topics Concern  . Not on file  Social History Narrative  . Not on file    REVIEW OF SYSTEMS: Constitutional: No fevers, chills, or sweats, no generalized fatigue, change in appetite Eyes: as above Ear, nose and throat: No hearing loss, ear pain, nasal congestion, sore throat Cardiovascular: No chest pain, palpitations Respiratory:  No shortness of breath at rest or with exertion, wheezes GastrointestinaI: No nausea, vomiting, diarrhea, abdominal pain, fecal incontinence Genitourinary:  No dysuria, urinary retention or frequency Musculoskeletal:  No neck pain, back pain Integumentary: No rash, pruritus, skin lesions Neurological: as above Psychiatric: No depression, insomnia, anxiety Endocrine: No palpitations, fatigue, diaphoresis, mood swings, change in appetite, change in weight, increased thirst Hematologic/Lymphatic:  No purpura, petechiae. Allergic/Immunologic: no itchy/runny eyes, nasal congestion, recent allergic reactions, rashes  PHYSICAL EXAM: Vitals:   10/08/17 1434  BP: 118/76  Pulse: 91  SpO2: 98%   General: No acute distress.  Patient appears well-groomed.  normal body habitus. Head:  Normocephalic/atraumatic Eyes:  Fundi examined but not visualized Neck: supple, no paraspinal  tenderness, full range of motion Heart:  Regular rate and rhythm Lungs:  Clear to auscultation bilaterally Back: No paraspinal tenderness Neurological Exam: alert and oriented to person, place, and time. Attention span and concentration intact, recent and remote memory intact, fund of knowledge intact.  Speech fluent and not dysarthric, language intact.  CN II-XII intact. Bulk and tone normal, muscle strength 5/5 throughout.  Sensation to light touch  intact.  Deep tendon reflexes 2+ throughout.  Finger to nose testing intact.  Gait normal, Romberg negative.  IMPRESSION: Migraine without aura, without status migrainosus, not intractable  PLAN: 1.  Continue Botox, Trokendi XR 25mg  2.  Diclofenac or Zomig for abortive therapy. 3.  Prednisone taper to break current daily headache 4.  Limit pain relievers to no more than 2 days out of week to prevent rebound headache. 5.  Keep headache diary 6.  Follow up for next Botox.  Shon MilletAdam Ahniya Mitchum, DO  CC:  Piedad ClimesWilliam Cody Martin, PA-C

## 2017-10-08 NOTE — Patient Instructions (Addendum)
1.  Continue Botox and Trokendi XR 25mg  2.  To break current daily headache, I will prescribe you a prednisone 10mg  tablet taper:  Take 6tabs x1day, then 5tabs x1day, then 4tabs x1day, then 3tabs x1day, then 2tabs x1day, then 1tab x1day, then STOP 3.  Follow up for next Botox.

## 2017-10-21 ENCOUNTER — Other Ambulatory Visit: Payer: Self-pay

## 2017-10-21 MED ORDER — CYANOCOBALAMIN 500 MCG/0.1ML NA SOLN: 500.0000 ug | NASAL | 11 refills | Status: DC

## 2017-10-22 ENCOUNTER — Telehealth: Payer: Self-pay

## 2017-10-22 NOTE — Telephone Encounter (Signed)
Pt called, she has had an increase in headaches and wanted to be seen. I advised her I would talk to Dr Everlena CooperJaffe and return her call. She mentioned increasing the Trokendi to see if she is able to tolerate it now. Per Dr Everlena CooperJaffe, increase Tokendi to 50mg  daily. Call in 4 wks if headaches are not any better, we can consider another medication. Called Pt and advised

## 2017-10-24 ENCOUNTER — Ambulatory Visit: Payer: Managed Care, Other (non HMO) | Admitting: Sports Medicine

## 2017-10-24 ENCOUNTER — Encounter: Payer: Self-pay | Admitting: Sports Medicine

## 2017-10-24 VITALS — BP 110/74 | HR 94 | Ht 65.0 in | Wt 125.8 lb

## 2017-10-24 DIAGNOSIS — M9902 Segmental and somatic dysfunction of thoracic region: Secondary | ICD-10-CM

## 2017-10-24 DIAGNOSIS — M9908 Segmental and somatic dysfunction of rib cage: Secondary | ICD-10-CM

## 2017-10-24 DIAGNOSIS — Z8669 Personal history of other diseases of the nervous system and sense organs: Secondary | ICD-10-CM

## 2017-10-24 DIAGNOSIS — M9901 Segmental and somatic dysfunction of cervical region: Secondary | ICD-10-CM

## 2017-10-24 DIAGNOSIS — M542 Cervicalgia: Secondary | ICD-10-CM

## 2017-10-24 DIAGNOSIS — M9900 Segmental and somatic dysfunction of head region: Secondary | ICD-10-CM

## 2017-10-24 MED ORDER — METHYLPREDNISOLONE ACETATE 80 MG/ML IJ SUSP
80.0000 mg | Freq: Once | INTRAMUSCULAR | Status: AC
  Administered 2017-10-24: 80 mg via INTRAMUSCULAR

## 2017-10-24 NOTE — Progress Notes (Signed)
Brenda Macias. [facility] [phone]  Jayden Rudge - 47 y.o. female MRN [id number]  Date of birth: [birth year]  Visit Date: 10/24/2017  PCP: Waldon Merl, PA-C   Referred by: Waldon Merl, PA-C  Scribe(s) for today's visit: Stevenson Clinch, CMA  SUBJECTIVE:  Brenda Arnt "[name]" is here for Follow-up (neck pain)   03/07/17: Her LT-sided neck and shoulder pain symptoms INITIALLY: Began about 2 months ago and the is no known MOI.  Neck pain: Described as mild stabbing pain, radiating to the upper back, shoulder, and neck.  Worsened with turning head to the LT or RT, LT > RT. Pain is worse when lying down at night.  Improved with rest. She has been seen by a chiropractor in the past, years ago, and got some relief of pain associated with migraine HA.  Additional associated symptoms include: LT shoulder pain At this time symptoms are worsening compared to onset, severity is getting worse over time.  LT shoulder: Described as moderate-severe aching at rest but shooting stabbing pain with movement.  Worsened with movement.  Improved with rest.  Additional associated symptoms include: LT-sided neck pain.   05/14/17: Compared to the last office visit, her previously described symptoms are improving, 1/10 Current symptoms are mild & are nonradiating She has been taking Gabapentin 100 mg aPM. She is experiencing increased fatigue but its manageable.  She received OMT 04/16/17. Still has slight pain when turning head side-to-side, it had completely resolved but returned over the past few days.   06/11/17: Compared to the last office visit on 05/14/17, her previously described neck pain symptoms are improving w/ no pain noted and improved cervical ROM. Current symptoms are none-mild & are nonradiating She has not been taking Gabapentin due to being ill recently and not being able to hold food down.  She received OMT on  her last visit (05/14/17).  She has been doing her [employer] and states that they have been going well.  She has not done them over the past 1-2 weeks due to being ill.  07/24/2017: Compared to the last office visit on 06/11/17, her previously described neck symptoms are improving but feels like something's "off" because she's been waking up w/ headaches, almost to the point of a migraine.  The HA will then subside w/in approximately one hour of waking.  She also notes a "knot" in her R scapula. Current symptoms are mild & are nonradiating She is no longer taking the Gabapentin.  She is still doing the [employer] but not consistently.  09/10/2017: Compared to the last office visit, her previously described symptoms are improving, she reports minimal to no pain.  Current symptoms are mild & are non-radiating. She reports that the knot near her scapula has gone away.  She has been doing Goodman's exercises intermittently.   10/24/2017: Compared to the last office visit, her previously described symptoms are worsening. She c/o increased tightness/stiffness on the right side of her neck/shoulder. She c/o daily HA since the end of July. HA pain is 6/10 on average but can be more severe at times. She reports sensitivity to light and sound with HA.  Current symptoms are moderate & are non-radiating She has been taking migraine med prn.    REVIEW OF SYSTEMS: Reports night time disturbances. Denies fevers, chills, or night sweats. Denies unexplained weight loss. Denies personal history of cancer. Denies changes in bowel or bladder habits. Denies recent  unreported falls. Denies new or worsening dyspnea or wheezing. Reports headaches.  Denies numbness, tingling or weakness  In the extremities.  Denies dizziness or presyncopal episodes Denies lower extremity edema     HISTORY & PERTINENT PRIOR DATA:  Significant/pertinent history, findings, studies include:  reports that she has never  smoked. She has never used smokeless tobacco. No results for input(s): HGBA1C, LABURIC, CREATINE in the last 8760 hours. No specialty comments available. Problem  Neck Pain    Otherwise prior history reviewed and updated per electronic medical record.    OBJECTIVE:  VS:  HT:5\' 5"  (165.1 cm)   WT:125 lb 12.8 oz (57.1 kg)  BMI:20.93    BP:110/74  HR:94bpm  TEMP: ( )  RESP:97 %   PHYSICAL EXAM: CONSTITUTIONAL: Well-developed, Well-nourished and In no acute distress Alert & appropriately interactive. and Not depressed or anxious appearing. RESPIRATORY: No increased work of breathing and Trachea Midline EYES: Pupils are equal., EOM intact without nystagmus. and No scleral icterus.  Upper extremities: Warm and well perfused NEURO: unremarkable Normal associated myotomal distribution strength to manual muscle testing Normal sensation to light touch  MSK Exam: Neck and upper back: . Well aligned, no significant deformity. . No overlying skin changes. . TTP over Right periscapular musculature as well as cervicocranial junction. . Normal, non-painful Flexion and extension as well as rotation and side bending slightly limited.  Nonpainful except for with terminal motion. .  . Normal, non-painful: Compression test Lhermitte's compression test.  No pain with arm squeeze test . Positive/Abnormal: Some pain with cervical extension but no radicular symptoms.   PROCEDURES & DATA REVIEWED:  Osteopathic manipulation was performed today based on physical exam findings.  Please see procedure note for further information including Osteopathic Exam findings  ASSESSMENT   1. Neck pain   2. History of migraine   3. Somatic dysfunction of rib cage region   4. Somatic dysfunction of thoracic region   5. Somatic dysfunction of cervical region   6. Somatic dysfunction of head region     PLAN:   Rest the injured area as much as practical Apply ice packs Apply heat (do not to sleep on  heating pad)    . Depo-Medrol 80 shot provided today given the persistent symptoms following manipulation.  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  Her cervicocranial junction with the mild dural bridge is likely contributing to the majority of her headaches. . Continue with meds per Dr. Everlena CooperJaffe. . Please see procedure section and notes. Neck pain Multifactorial headaches that are mild.  Greatly diminished cranial rhythmic impulse with SBS compression likely large protruding factor to this.  She had any fairly dramatic response with light headedness and dizziness with cranial osteopathy reporting pulsation likely if the nausea coinciding with her cranial rhythmic wave.  Would like to check in with her in a week to consider repeating manipulation at that time we will provide her a shot of Medrol 80 mg today to try to minimize the rebound response over the weekend.   Follow-up: Return in about 1 week (around 10/31/2017) for consideration of repeat Osteopathic Manipulation.      Please see additional documentation for Objective, Assessment and Plan sections. Pertinent additional documentation may be included in corresponding procedure notes, imaging studies, problem based documentation and patient instructions. Please see these sections of the encounter for additional information regarding this visit.  CMA/ATC served as Neurosurgeonscribe  during this visit. History, Physical, and Plan performed by medical provider. Documentation and orders reviewed and attested to.      Gerda Diss, [facility] Physician

## 2017-10-24 NOTE — Assessment & Plan Note (Addendum)
Multifactorial headaches that are moderate to severe and significantly worsened over the past several weeks..  Greatly diminished cranial rhythmic impulse with SBS compression likely large protruding factor to this.  She had any fairly dramatic response with light headedness and dizziness with cranial osteopathy reporting pulsation likely if the nausea coinciding with her cranial rhythmic wave.  Would like to check in with her in a week to consider repeating manipulation at that time we will provide her a shot of Medrol 80 mg today to try to minimize the rebound response over the weekend.

## 2017-10-24 NOTE — Progress Notes (Signed)
PROCEDURE NOTE : OSTEOPATHIC MANIPULATION The decision today to treat with Osteopathic Manipulative Therapy (OMT) was based on physical exam findings. Verbal consent was obtained following a discussion with the patient regarding the of risks, benefits and potential side effects, including an acute pain flare,post manipulation soreness and need for repeat treatments.     Contraindications to OMT: NONE  Manipulation was performed as below: Regions treated: Cervical spine, Thoracic spine, Ribs and Cranial OMT Techniques Used: HVLA, muscle energy, myofascial release, articulatory, counterstrain, cranial and facilitated positional release    The patient tolerated the treatment well and reported some improvement of symptoms as well as some exacerbation of nausea slight change in headache character following treatment today. Patient was given medications, exercises, stretches and lifestyle modifications per AVS and verbally.   OSTEOPATHIC/STRUCTURAL EXAM:   OA - rotated right AA - Extended C2 FRS right (Flexed, Rotated & Sidebent) C5 Extended rotated left, side bent left* C6 rotated right, side bent right* T2 -4 Neutral, Rotated RIGHT, Sidebent LEFT T6 FRS right (Flexed, Rotated & Sidebent) Rib 8 Right  Posterior Cranial SBS compression

## 2017-10-30 ENCOUNTER — Ambulatory Visit: Payer: Managed Care, Other (non HMO) | Admitting: Neurology

## 2017-10-31 ENCOUNTER — Ambulatory Visit: Payer: Managed Care, Other (non HMO) | Admitting: Sports Medicine

## 2017-10-31 ENCOUNTER — Other Ambulatory Visit: Payer: Self-pay | Admitting: Physician Assistant

## 2017-10-31 ENCOUNTER — Encounter: Payer: Self-pay | Admitting: Sports Medicine

## 2017-10-31 VITALS — BP 100/78 | HR 85 | Ht 65.0 in | Wt 125.8 lb

## 2017-10-31 DIAGNOSIS — M9903 Segmental and somatic dysfunction of lumbar region: Secondary | ICD-10-CM

## 2017-10-31 DIAGNOSIS — M9904 Segmental and somatic dysfunction of sacral region: Secondary | ICD-10-CM

## 2017-10-31 DIAGNOSIS — M9902 Segmental and somatic dysfunction of thoracic region: Secondary | ICD-10-CM | POA: Diagnosis not present

## 2017-10-31 DIAGNOSIS — M9905 Segmental and somatic dysfunction of pelvic region: Secondary | ICD-10-CM | POA: Diagnosis not present

## 2017-10-31 DIAGNOSIS — M542 Cervicalgia: Secondary | ICD-10-CM

## 2017-10-31 DIAGNOSIS — M9908 Segmental and somatic dysfunction of rib cage: Secondary | ICD-10-CM | POA: Diagnosis not present

## 2017-10-31 DIAGNOSIS — M9901 Segmental and somatic dysfunction of cervical region: Secondary | ICD-10-CM | POA: Diagnosis not present

## 2017-10-31 DIAGNOSIS — Z8669 Personal history of other diseases of the nervous system and sense organs: Secondary | ICD-10-CM

## 2017-10-31 NOTE — Patient Instructions (Addendum)
Please perform the exercise program that we have prepared for you and gone over in detail on a daily basis.  In addition to the handout you were provided you can access your program through: [email or web address]   Your unique program code is:  [email or web address]

## 2017-10-31 NOTE — Progress Notes (Signed)
PROCEDURE NOTE : OSTEOPATHIC MANIPULATION The decision today to treat with Osteopathic Manipulative Therapy (OMT) was based on physical exam findings. Verbal consent was obtained following a discussion with the patient regarding the of risks, benefits and potential side effects, including an acute pain flare,post manipulation soreness and need for repeat treatments.     Contraindications to OMT: NONE  Manipulation was performed as below: Regions treated: Cervical spine, Thoracic spine, Ribs, Lumbar spine, Pelvis, Sacrum and Cranial OMT Techniques Used: HVLA, muscle energy, myofascial release, articulatory, counterstrain, cranial and facilitated positional release    The patient tolerated the treatment well and reported some improvement of symptoms as well as some exacerbation of nausea slight change in headache character following treatment today. Patient was given medications, exercises, stretches and lifestyle modifications per AVS and verbally.   OSTEOPATHIC/STRUCTURAL EXAM:   OA - rotated right AA - Extended C2 FRS right (Flexed, Rotated & Sidebent) C5 Extended rotated left, side bent left C6 rotated right, side bent right T4 -8 Neutral, Rotated LEFT, Sidebent RIGHT Rib 8 Right  Posterior L4 FRS right (Flexed, Rotated & Sidebent) Right anterior innonimate R on L sacral torsion Cranial SBS compression

## 2017-10-31 NOTE — Progress Notes (Signed)

## 2017-10-31 NOTE — Progress Notes (Signed)
Brenda Macias. Brenda Macias [facility] at [facility] [phone]  Brenda Macias - 47 y.o. female MRN [id number]  Date of birth: Dec 26, 1970  Visit Date: 10/31/2017  PCP: Waldon Merl, PA-C   Referred by: Waldon Merl, PA-C  Scribe(s) for today's visit: Stevenson Clinch, CMA  SUBJECTIVE:  Brenda Macias "[name]" is here for Follow-up (neck pain)   03/07/17: Her LT-sided neck and shoulder pain symptoms INITIALLY: Began about 2 months ago and the is no known MOI.  Neck pain: Described as mild stabbing pain, radiating to the upper back, shoulder, and neck.  Worsened with turning head to the LT or RT, LT > RT. Pain is worse when lying down at night.  Improved with rest. She has been seen by a chiropractor in the past, years ago, and got some relief of pain associated with migraine HA.  Additional associated symptoms include: LT shoulder pain At this time symptoms are worsening compared to onset, severity is getting worse over time.  LT shoulder: Described as moderate-severe aching at rest but shooting stabbing pain with movement.  Worsened with movement.  Improved with rest.  Additional associated symptoms include: LT-sided neck pain.   05/14/17: Compared to the last office visit, her previously described symptoms are improving, 1/10 Current symptoms are mild & are nonradiating She has been taking Gabapentin 100 mg aPM. She is experiencing increased fatigue but its manageable.  She received OMT 04/16/17. Still has slight pain when turning head side-to-side, it had completely resolved but returned over the past few days.   06/11/17: Compared to the last office visit on 05/14/17, her previously described neck pain symptoms are improving w/ no pain noted and improved cervical ROM. Current symptoms are none-mild & are nonradiating She has not been taking Gabapentin due to being ill recently and not being able to hold food down.  She received OMT on  her last visit (05/14/17).  She has been doing her [employer] and states that they have been going well.  She has not done them over the past 1-2 weeks due to being ill.  07/24/2017: Compared to the last office visit on 06/11/17, her previously described neck symptoms are improving but feels like something's "off" because she's been waking up w/ headaches, almost to the point of a migraine.  The HA will then subside w/in approximately one hour of waking.  She also notes a "knot" in her R scapula. Current symptoms are mild & are nonradiating She is no longer taking the Gabapentin.  She is still doing the [employer] but not consistently.  09/10/2017: Compared to the last office visit, her previously described symptoms are improving, she reports minimal to no pain.  Current symptoms are mild & are non-radiating. She reports that the knot near her scapula has gone away.  She has been doing Goodman's exercises intermittently.   10/24/2017: Compared to the last office visit, her previously described symptoms are worsening. She c/o increased tightness/stiffness on the right side of her neck/shoulder. She c/o daily HA since the end of July. HA pain is 6/10 on average but can be more severe at times. She reports sensitivity to light and sound with HA.  Current symptoms are moderate & are non-radiating She has been taking migraine med prn.   10/31/2017: Compared to the last office visit, her previously described symptoms are improving. She reports that today is the first day that she hasn't had a HA.  Current symptoms are moderate &  are nonradiating She has been migraine medication. She has been icing her neck prn.    REVIEW OF SYSTEMS: Reports night time disturbances. Denies fevers, chills, or night sweats. Denies unexplained weight loss. Denies personal history of cancer. Denies changes in bowel or bladder habits. Denies recent unreported falls. Denies new or worsening dyspnea or  wheezing. Reports headaches.  Denies numbness, tingling or weakness  In the extremities.  Denies dizziness or presyncopal episodes Denies lower extremity edema     HISTORY & PERTINENT PRIOR DATA:  Significant/pertinent history, findings, studies include:  reports that she has never smoked. She has never used smokeless tobacco. No results for input(s): HGBA1C, LABURIC, CREATINE in the last 8760 hours. No specialty comments available. No problems updated.  Otherwise prior history reviewed and updated per electronic medical record.    OBJECTIVE:  VS:  HT:5\' 5"  (165.1 cm)   WT:125 lb 12.8 oz (57.1 kg)  BMI:20.93    BP:100/78  HR:85bpm  TEMP: ( )  RESP:98 %   PHYSICAL EXAM: CONSTITUTIONAL: Well-developed, Well-nourished and In no acute distress Alert & appropriately interactive. and Not depressed or anxious appearing. RESPIRATORY: No increased work of breathing and Trachea Midline EYES: Pupils are equal., EOM intact without nystagmus. and No scleral icterus.  Upper extremities: Warm and well perfused NEURO: unremarkable Normal associated myotomal distribution strength to manual muscle testing Normal sensation to light touch  MSK Exam: Good range of motion.  She has negative neural tension signs.  She does have some limitation in the left side bending and restriction within the scalene muscle and levator scapula.  She does have a somewhat limited mobility of the thoracic spine and slight scoliosis with rightward curvature.  PROCEDURES & DATA REVIEWED:  . Osteopathic manipulation was performed today based on physical exam findings.  Please see procedure note for further information including Osteopathic Exam findings . Discussed the foundation of treatment for this condition is physical therapy and/or daily (5-6 days/week) therapeutic exercises, focusing on core strengthening, coordination, neuromuscular control/reeducation.  Therapeutic exercises prescribed per procedure  note.  ASSESSMENT   1. Neck pain   2. History of migraine   3. Somatic dysfunction of rib cage region   4. Somatic dysfunction of thoracic region   5. Somatic dysfunction of lumbar region   6. Somatic dysfunction of pelvis region   7. Somatic dysfunction of sacral region   8. Somatic dysfunction of cervical region     PLAN:       . 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 . Attention paid to the lumbar, thoracic, sacral and pelvic regions today with improved symptoms following manipulation although slight exacerbation of the CRI symptoms. . Continue with home therapeutic exercises with additional ones provided today. . Can consider repeating cranial osteopathic technique at follow-up although she did have a slight flare with this following the last treatment. . Please see procedure section and notes. No problem-specific Assessment & Plan notes found for this encounter.    Follow-up: Return in about 2 weeks (around 11/14/2017) for consideration of repeat Osteopathic Manipulation.      Please see additional documentation for Objective, Assessment and Plan sections. Pertinent additional documentation may be included in corresponding procedure notes, imaging studies, problem based documentation and patient instructions. Please see these sections of the encounter for additional information regarding this visit.  CMA/ATC served as Neurosurgeonscribe during this visit. History, Physical, and Plan performed by medical provider. Documentation and  orders reviewed and attested to.      Gerda Diss, [place] [facility] Physician

## 2017-11-05 ENCOUNTER — Encounter: Payer: Self-pay | Admitting: Sports Medicine

## 2017-11-05 ENCOUNTER — Ambulatory Visit: Payer: Managed Care, Other (non HMO) | Admitting: Sports Medicine

## 2017-11-05 VITALS — BP 108/64 | HR 91 | Ht 65.0 in | Wt 126.0 lb

## 2017-11-05 DIAGNOSIS — Z8669 Personal history of other diseases of the nervous system and sense organs: Secondary | ICD-10-CM

## 2017-11-05 DIAGNOSIS — M9902 Segmental and somatic dysfunction of thoracic region: Secondary | ICD-10-CM

## 2017-11-05 DIAGNOSIS — M9908 Segmental and somatic dysfunction of rib cage: Secondary | ICD-10-CM

## 2017-11-05 DIAGNOSIS — M9904 Segmental and somatic dysfunction of sacral region: Secondary | ICD-10-CM

## 2017-11-05 DIAGNOSIS — M9901 Segmental and somatic dysfunction of cervical region: Secondary | ICD-10-CM | POA: Diagnosis not present

## 2017-11-05 DIAGNOSIS — M542 Cervicalgia: Secondary | ICD-10-CM

## 2017-11-05 DIAGNOSIS — M9900 Segmental and somatic dysfunction of head region: Secondary | ICD-10-CM

## 2017-11-05 NOTE — Progress Notes (Signed)
Brenda Macias. Brenda Macias Sports Medicine [facility] at [facility] [phone]  Brenda Macias - 47 y.o. female MRN [id number]  Date of birth: [birth year]  Visit Date: 11/05/2017  PCP: Waldon Merl, PA-C   Referred by: Waldon Merl, PA-C  Scribe(s) for today's visit: Inocencio Homes, CMA  SUBJECTIVE:  Brenda Macias "[name]" is here for Neck Pain (f/u for lt sided neck and shoulder pain )   03/07/17: Her LT-sided neck and shoulder pain symptoms INITIALLY: Began about 2 months ago and the is no known MOI.  Neck pain: Described as mild stabbing pain, radiating to the upper back, shoulder, and neck.  Worsened with turning head to the LT or RT, LT > RT. Pain is worse when lying down at night.  Improved with rest. She has been seen by a chiropractor in the past, years ago, and got some relief of pain associated with migraine HA.  Additional associated symptoms include: LT shoulder pain At this time symptoms are worsening compared to onset, severity is getting worse over time.  LT shoulder: Described as moderate-severe aching at rest but shooting stabbing pain with movement.  Worsened with movement.  Improved with rest.  Additional associated symptoms include: LT-sided neck pain.   05/14/17: Compared to the last office visit, her previously described symptoms are improving, 1/10 Current symptoms are mild & are nonradiating She has been taking Gabapentin 100 mg aPM. She is experiencing increased fatigue but its manageable.  She received OMT 04/16/17. Still has slight pain when turning head side-to-side, it had completely resolved but returned over the past few days.   06/11/17: Compared to the last office visit on 05/14/17, her previously described neck pain symptoms are improving w/ no pain noted and improved cervical ROM. Current symptoms are none-mild & are nonradiating She has not been taking Gabapentin due to being ill recently and not being able to hold  food down.  She received OMT on her last visit (05/14/17).  She has been doing her [employer] and states that they have been going well.  She has not done them over the past 1-2 weeks due to being ill.  07/24/2017: Compared to the last office visit on 06/11/17, her previously described neck symptoms are improving but feels like something's "off" because she's been waking up w/ headaches, almost to the point of a migraine.  The HA will then subside w/in approximately one hour of waking.  She also notes a "knot" in her R scapula. Current symptoms are mild & are nonradiating She is no longer taking the Gabapentin.  She is still doing the [employer] but not consistently.  09/10/2017: Compared to the last office visit, her previously described symptoms are improving, she reports minimal to no pain.  Current symptoms are mild & are non-radiating. She reports that the knot near her scapula has gone away.  She has been doing Goodman's exercises intermittently.   10/24/2017: Compared to the last office visit, her previously described symptoms are worsening. She c/o increased tightness/stiffness on the right side of her neck/shoulder. She c/o daily HA since the end of July. HA pain is 6/10 on average but can be more severe at times. She reports sensitivity to light and sound with HA.  Current symptoms are moderate & are non-radiating She has been taking migraine med prn.   10/31/2017: Compared to the last office visit, her previously described symptoms are improving. She reports that today is the first day that she hasn't  had a HA.  Current symptoms are moderate & are nonradiating She has been migraine medication. She has been icing her neck prn.   11/05/2017  Compared to the last office visit, her previously described symptoms Daily headache has improved but after doing exercises the neck pain has increased. She was in the middle of completing exercises and stopped due to tightness in neck. She then  developed a migraine that was relived with her normal medication. Her daily headache has improved from last office visit but has had increased neck and top of right shoulder pain.  Current symptoms are moderate & are radiating to top of right shoulder.  She has been doing exercises, and medications.  NSAID and stretches only done one time due to increased pain.      REVIEW OF SYSTEMS: Reports night time disturbances. Denies fevers, chills, or night sweats. Denies unexplained weight loss. Denies personal history of cancer. Denies changes in bowel or bladder habits. Denies recent unreported falls. Denies new or worsening dyspnea or wheezing. Reports headaches.  Denies numbness, tingling or weakness  In the extremities.  Denies dizziness or presyncopal episodes Denies lower extremity edema     HISTORY & PERTINENT PRIOR DATA:  Significant/pertinent history, findings, studies include:  reports that she has never smoked. She has never used smokeless tobacco. No results for input(s): HGBA1C, LABURIC, CREATINE in the last 8760 hours. No specialty comments available. No problems updated.  Otherwise prior history reviewed and updated per electronic medical record.    OBJECTIVE:  VS:  HT:5\' 5"  (165.1 cm)   WT:126 lb (57.2 kg)  BMI:20.97    BP:108/64  HR:91bpm  TEMP: ( )  RESP:99 %   PHYSICAL EXAM: CONSTITUTIONAL: Well-developed, Well-nourished and In no acute distress Psychiatric: Alert & appropriately interactive. and Not depressed or anxious appearing. RESPIRATORY: No increased work of breathing and Trachea Midline EYES: Pupils are equal., EOM intact without nystagmus. and No scleral icterus.  Upper extremities: EXTREMITY EXAM: Warm and well perfused NEURO: unremarkable Normal associated myotomal distribution strength to manual muscle testing Normal sensation to light touch  MSK Exam: Cervical range of motion is limited in cervical sidebending and rotation but this is  improved compared in the past.  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  No significant symptoms of impending doom or other symptoms consistent with VBA issues.  PROCEDURES & DATA REVIEWED:  . Osteopathic manipulation was performed today based on physical exam findings.  Please see procedure note for further information including Osteopathic Exam findings . Discussed the foundation of treatment for this condition is physical therapy and/or daily (5-6 days/week) therapeutic exercises, focusing on core strengthening, coordination, neuromuscular control/reeducation.  Therapeutic exercises prescribed per procedure note.  ASSESSMENT   1. Neck pain   2. History of migraine   3. Somatic dysfunction of rib cage region   4. Somatic dysfunction of thoracic region   5. Somatic dysfunction of sacral region   6. Somatic dysfunction of cervical region   7. Somatic dysfunction of head region     PLAN:  . Overall she continues to show steady improvement and some of her migraine type symptoms although she has had a persistent intractable migraine for several weeks now.  The cranial osteopathy has been helpful she feels however and this is repeated again today. . Discussed red flag symptoms that warrant earlier emergent evaluation and patient voices understanding. No problem-specific Assessment & Plan notes found for this encounter.  Follow-up: Return in about  2 weeks (around 11/19/2017) for consideration of repeat Osteopathic Manipulation.      Please see additional documentation for Objective, Assessment and Plan sections. Pertinent additional documentation may be included in corresponding procedure notes, imaging studies, problem based documentation and patient instructions. Please see these sections of the encounter for additional information regarding this visit.  CMA/ATC served as Neurosurgeon during this visit. History, Physical, and Plan performed by medical  provider. Documentation and orders reviewed and attested to.      [name], DO    [place] [facility] Physician

## 2017-11-07 ENCOUNTER — Other Ambulatory Visit: Payer: Self-pay

## 2017-11-07 MED ORDER — TOPIRAMATE ER 50 MG PO CAP24: 50.0000 mg | ORAL_CAPSULE | Freq: Every day | ORAL | 6 refills | Status: DC

## 2017-11-14 ENCOUNTER — Ambulatory Visit: Payer: Managed Care, Other (non HMO) | Admitting: Sports Medicine

## 2017-11-18 ENCOUNTER — Encounter: Payer: Self-pay | Admitting: Physician Assistant

## 2017-11-18 ENCOUNTER — Other Ambulatory Visit: Payer: Self-pay

## 2017-11-18 ENCOUNTER — Ambulatory Visit: Payer: Managed Care, Other (non HMO) | Admitting: Physician Assistant

## 2017-11-18 VITALS — BP 88/60 | HR 82 | Temp 97.8°F | Resp 14 | Ht 65.0 in | Wt 124.0 lb

## 2017-11-18 DIAGNOSIS — J209 Acute bronchitis, unspecified: Secondary | ICD-10-CM | POA: Diagnosis not present

## 2017-11-18 MED ORDER — ALBUTEROL SULFATE (2.5 MG/3ML) 0.083% IN NEBU
2.5000 mg | INHALATION_SOLUTION | Freq: Once | RESPIRATORY_TRACT | Status: AC
  Administered 2017-11-18: 2.5 mg via RESPIRATORY_TRACT

## 2017-11-18 MED ORDER — DOXYCYCLINE HYCLATE 100 MG PO CAPS: 100.0000 mg | ORAL_CAPSULE | Freq: Two times a day (BID) | ORAL | 0 refills | Status: DC

## 2017-11-18 MED ORDER — PREDNISONE 20 MG PO TABS: 20.0000 mg | ORAL_TABLET | Freq: Every day | ORAL | 0 refills | Status: DC

## 2017-11-18 MED ORDER — BENZONATATE 100 MG PO CAPS: 100.0000 mg | ORAL_CAPSULE | Freq: Two times a day (BID) | ORAL | 0 refills | Status: DC | PRN

## 2017-11-18 NOTE — Progress Notes (Signed)
Patient presents to clinic today c/o 1 week of worsening cough, chest congestion, fatigue. Notes cough is in spells and associated with some chest tightness. Endorses recent vacation with short plane-trip. Denies sick contact. Has not taken anything today for symptoms. Has been trying to keep hydrated.  Past Medical History:  Diagnosis Date  . Endometriosis   . Kidney stones    Cyst and Kidney Stones  . Migraines    pt states onset around the age of 47    Current Outpatient Medications on File Prior to Visit  Medication Sig Dispense Refill  . Cyanocobalamin (NASCOBAL) 500 MCG/0.1ML SOLN Place 0.1 mLs (500 mcg total) into the nose once a week. 4 Bottle 11  . dexlansoprazole (DEXILANT) 60 MG capsule Take 1 capsule (60 mg total) by mouth daily. 30 capsule 5  . diclofenac (VOLTAREN) 25 MG EC tablet Take 25 mg by mouth daily as needed. Pt unsure of dosage.     . OnabotulinumtoxinA (BOTOX IJ) Botox    . ondansetron (ZOFRAN ODT) 4 MG disintegrating tablet Take 1 tablet (4 mg total) by mouth every 8 (eight) hours as needed for nausea or vomiting. 20 tablet 0  . ranitidine (ZANTAC) 300 MG tablet TAKE 1 TABLET BY MOUTH EVERYDAY AT BEDTIME 90 tablet 0  . Topiramate ER (TROKENDI XR) 50 MG CP24 Take 50 mg by mouth daily. 30 capsule 6  . VIVELLE-DOT 0.1 MG/24HR patch APPLY 1 PATCH TWICE WEEKLY  0  . zolmitriptan (ZOMIG-ZMT) 5 MG disintegrating tablet TAKE 1 TABLET ONCE A DAY AS NEEDED (Patient taking differently: as needed) 10 tablet 1   No current facility-administered medications on file prior to visit.     Allergies  Allergen Reactions  . Penicillins Rash    rash  . Rocephin [Ceftriaxone] Rash    Rash    Family History  Problem Relation Age of Onset  . Endometriosis Mother   . Hypertension Father   . Kidney cancer Father   . Thyroid cancer Father   . Cystic kidney disease Father   . Healthy Brother   . Healthy Brother   . Stomach cancer Neg Hx   . Colon cancer Neg Hx      Social History   Socioeconomic History  . Marital status: Married    Spouse name: Not on file  . Number of children: Not on file  . Years of education: Not on file  . Highest education level: Not on file  Occupational History  . Not on file  Social Needs  . Financial resource strain: Not on file  . Food insecurity:    Worry: Not on file    Inability: Not on file  . Transportation needs:    Medical: Not on file    Non-medical: Not on file  Tobacco Use  . Smoking status: Never Smoker  . Smokeless tobacco: Never Used  Substance and Sexual Activity  . Alcohol use: No  . Drug use: No  . Sexual activity: Yes    Partners: Male    Birth control/protection: Surgical    Comment: Hysterectomy  Lifestyle  . Physical activity:    Days per week: Not on file    Minutes per session: Not on file  . Stress: Not on file  Relationships  . Social connections:    Talks on phone: Not on file    Gets together: Not on file    Attends religious service: Not on file    Active member of club or organization: Not  on file    Attends meetings of clubs or organizations: Not on file    Relationship status: Not on file  Other Topics Concern  . Not on file  Social History Narrative  . Not on file   Review of Systems - See HPI.  All other ROS are negative.  BP (!) 88/60   Pulse 82   Temp 97.8 F (36.6 C) (Oral)   Resp 14   Ht 5\' 5"  (1.651 m)   Wt 124 lb (56.2 kg)   SpO2 99%   BMI 20.63 kg/m   Physical Exam  Constitutional: She is oriented to person, place, and time. She appears well-developed and well-nourished.  HENT:  Head: Normocephalic and atraumatic.  Right Ear: External ear normal.  Left Ear: External ear normal.  Nose: Nose normal.  Mouth/Throat: Oropharynx is clear and moist.  Neck: Neck supple.  Cardiovascular: Normal rate, regular rhythm, normal heart sounds and intact distal pulses.  Pulmonary/Chest: Effort normal. No stridor. No respiratory distress. She has  wheezes. She has no rales. She exhibits no tenderness.  Neurological: She is alert and oriented to person, place, and time.  Psychiatric: She has a normal mood and affect.  Vitals reviewed.  Assessment/Plan: 1. Acute bronchitis, unspecified organism Rx Doxycycline.  Increase fluids.  Rest.  Saline nasal spray.  Probiotic.  Mucinex as directed.  Humidifier in bedroom. Start Tessalon and Prednisone burst. Wheeze improved with in office albuterol nebulizer.  Call or return to clinic if symptoms are not improving.  - albuterol (PROVENTIL) (2.5 MG/3ML) 0.083% nebulizer solution 2.5 mg - doxycycline (VIBRAMYCIN) 100 MG capsule; Take 1 capsule (100 mg total) by mouth 2 (two) times daily.  Dispense: 14 capsule; Refill: 0 - benzonatate (TESSALON) 100 MG capsule; Take 1 capsule (100 mg total) by mouth 2 (two) times daily as needed for cough.  Dispense: 20 capsule; Refill: 0 - predniSONE (DELTASONE) 20 MG tablet; Take 1 tablet (20 mg total) by mouth daily with breakfast.  Dispense: 10 tablet; Refill: 0   Piedad Climes, PA-C

## 2017-11-18 NOTE — Patient Instructions (Signed)
Take antibiotic (Doxycycline) as directed.  Increase fluids.  Get plenty of rest. Use Mucinex for congestion. Take steroid as directed. Take a daily probiotic (I recommend Align or Culturelle, but even Activia Yogurt may be beneficial).  A humidifier placed in the bedroom may offer some relief for a dry, scratchy throat of nasal irritation.  Read information below on acute bronchitis. Please call or return to clinic if symptoms are not improving.  Acute Bronchitis Bronchitis is when the airways that extend from the windpipe into the lungs get red, puffy, and painful (inflamed). Bronchitis often causes thick spit (mucus) to develop. This leads to a cough. A cough is the most common symptom of bronchitis. In acute bronchitis, the condition usually begins suddenly and goes away over time (usually in 2 weeks). Smoking, allergies, and asthma can make bronchitis worse. Repeated episodes of bronchitis may cause more lung problems.  HOME CARE  Rest.  Drink enough fluids to keep your pee (urine) clear or pale yellow (unless you need to limit fluids as told by your doctor).  Only take over-the-counter or prescription medicines as told by your doctor.  Avoid smoking and secondhand smoke. These can make bronchitis worse. If you are a smoker, think about using nicotine gum or skin patches. Quitting smoking will help your lungs heal faster.  Reduce the chance of getting bronchitis again by:  Washing your hands often.  Avoiding people with cold symptoms.  Trying not to touch your hands to your mouth, nose, or eyes.  Follow up with your doctor as told.  GET HELP IF: Your symptoms do not improve after 1 week of treatment. Symptoms include:  Cough.  Fever.  Coughing up thick spit.  Body aches.  Chest congestion.  Chills.  Shortness of breath.  Sore throat.  GET HELP RIGHT AWAY IF:   You have an increased fever.  You have chills.  You have severe shortness of breath.  You have  bloody thick spit (sputum).  You throw up (vomit) often.  You lose too much body fluid (dehydration).  You have a severe headache.  You faint.  MAKE SURE YOU:   Understand these instructions.  Will watch your condition.  Will get help right away if you are not doing well or get worse. Document Released: 08/08/2007 Document Revised: 10/22/2012 Document Reviewed: 08/12/2012 [facility] Patient Information 2015 [employer] [place]. This information is not intended to replace advice given to you by your health care provider. Make sure you discuss any questions you have with your health care provider.

## 2017-11-19 ENCOUNTER — Encounter: Payer: Self-pay | Admitting: Sports Medicine

## 2017-11-19 NOTE — Progress Notes (Signed)
PROCEDURE NOTE : OSTEOPATHIC MANIPULATION The decision today to treat with Osteopathic Manipulative Therapy (OMT) was based on physical exam findings. Verbal consent was obtained following a discussion with the patient regarding the of risks, benefits and potential side effects, including an acute pain flare,post manipulation soreness and need for repeat treatments. Additionally, we specifically discussed the minimal risk of  injury to neurovascular structures associated with Cervical manipulation.   Contraindications to OMT: NONE  Manipulation was performed as below: Regions Treated OMT Techniques Used  Cervical spine Thoracic spine Ribs Sacrum Cranial HVLA muscle energy myofascial release counterstrain cranial   The patient tolerated the treatment well and reported Improved symptoms following treatment today. Patient was given medications, exercises, stretches and lifestyle modifications per AVS and verbally.   OSTEOPATHIC/STRUCTURAL EXAM:   OA - rotated right T2 -8 Neutral, Rotated LEFT, Sidebent RIGHT Rib 6 Right  Posterior Left unilateral flexion SBS compression

## 2017-11-20 ENCOUNTER — Ambulatory Visit: Payer: Self-pay | Admitting: *Deleted

## 2017-11-20 ENCOUNTER — Other Ambulatory Visit: Payer: Self-pay

## 2017-11-20 ENCOUNTER — Encounter: Payer: Self-pay | Admitting: Physician Assistant

## 2017-11-20 ENCOUNTER — Telehealth: Payer: Self-pay

## 2017-11-20 ENCOUNTER — Ambulatory Visit: Payer: Managed Care, Other (non HMO) | Admitting: Physician Assistant

## 2017-11-20 VITALS — BP 102/70 | HR 74 | Temp 97.7°F | Resp 14 | Ht 65.0 in | Wt 125.0 lb

## 2017-11-20 DIAGNOSIS — J208 Acute bronchitis due to other specified organisms: Secondary | ICD-10-CM

## 2017-11-20 DIAGNOSIS — B9689 Other specified bacterial agents as the cause of diseases classified elsewhere: Secondary | ICD-10-CM | POA: Diagnosis not present

## 2017-11-20 MED ORDER — AZITHROMYCIN 250 MG PO TABS: ORAL_TABLET | ORAL | 0 refills | Status: DC

## 2017-11-20 NOTE — Telephone Encounter (Signed)
Pt called with complaints of worsening bronchitis; she was seen in office on 11/18/17; she says that she feels worse because her chest feels tight all the time and within the past hour she has felt nauseous; recommendations made per nurse triage protocol to include seeing a physician within 24 hours; pt offered and accepted appointment with Marcelline MatesWilliam Martin, [facility], 11/20/17 at 1315; she verbalizes understanding; will route to office for notification of this upcoming visit. Reason for Disposition . [1] Continuous (nonstop) coughing interferes with work or school AND [2] no improvement using cough treatment per protocol  Answer Assessment - Initial Assessment Questions 1. ONSET: "When did the cough begin?"      11/14/17 2. SEVERITY: "How bad is the cough today?"      About the same as it was in office; rated moderate  3. RESPIRATORY DISTRESS: "Describe your breathing."      Chest feels heavy 4. FEVER: "Do you have a fever?" If so, ask: "What is your temperature, how was it measured, and when did it start?"     no 5. HEMOPTYSIS: "Are you coughing up any blood?" If so ask: "How much?" (flecks, streaks, tablespoons, etc.)     no 6. TREATMENT: "What have you done so far to treat the cough?" (e.g., meds, fluids, humidifier)     Tessalon pearls 7. CARDIAC HISTORY: "Do you have any history of heart disease?" (e.g., heart attack, congestive heart failure)      mo 8. LUNG HISTORY: "Do you have any history of lung disease?"  (e.g., pulmonary embolus, asthma, emphysema)    bronchitis 9. PE RISK FACTORS: "Do you have a history of blood clots?" (or: recent major surgery, recent prolonged travel, bedridden)     no 10. OTHER SYMPTOMS: "Do you have any other symptoms? (e.g., runny nose, wheezing, chest pain)       Stuffy nose, wheezing 11. PREGNANCY: "Is there any chance you are pregnant?" "When was your last menstrual period?"       No hysterectomy 12. TRAVEL: "Have you traveled out of the country in  the last month?" (e.g., travel history, exposures)       [place]  Protocols used: COUGH - ACUTE NON-PRODUCTIVE-A-AH

## 2017-11-20 NOTE — Telephone Encounter (Signed)
[facility] by 10/26/17 [phone]#[id number]

## 2017-11-20 NOTE — Patient Instructions (Signed)
Please stop the Doxycycline as it is causing nausea. You can use some of your Zofran to help with nausea until the antibiotic is out of your system. Start the Azithromycin as directed.  Continue other medications and supportive measures discussed at last visit.

## 2017-11-20 NOTE — Progress Notes (Signed)
Patient presents to clinic today c/o nausea since starting Doxycycline for bacterial bronchitis. Notes one episode of non-bloody emesis. Has been tolerating the prednisone and tessalon as directed. Denies fever since visit on Monday. Notes continued fatigue but improving. Denies loose stools. Denies chest pain or LH.    Past Medical History:  Diagnosis Date  . Endometriosis   . Kidney stones    Cyst and Kidney Stones  . Migraines    pt states onset around the age of 47    Current Outpatient Medications on File Prior to Visit  Medication Sig Dispense Refill  . benzonatate (TESSALON) 100 MG capsule Take 1 capsule (100 mg total) by mouth 2 (two) times daily as needed for cough. 20 capsule 0  . Cyanocobalamin (NASCOBAL) 500 MCG/0.1ML SOLN Place 0.1 mLs (500 mcg total) into the nose once a week. 4 Bottle 11  . dexlansoprazole (DEXILANT) 60 MG capsule Take 1 capsule (60 mg total) by mouth daily. 30 capsule 5  . diclofenac (VOLTAREN) 25 MG EC tablet Take 25 mg by mouth daily as needed. Pt unsure of dosage.     Marland Kitchen. doxycycline (VIBRAMYCIN) 100 MG capsule Take 1 capsule (100 mg total) by mouth 2 (two) times daily. 14 capsule 0  . OnabotulinumtoxinA (BOTOX IJ) Botox    . ondansetron (ZOFRAN ODT) 4 MG disintegrating tablet Take 1 tablet (4 mg total) by mouth every 8 (eight) hours as needed for nausea or vomiting. 20 tablet 0  . predniSONE (DELTASONE) 20 MG tablet Take 1 tablet (20 mg total) by mouth daily with breakfast. 10 tablet 0  . ranitidine (ZANTAC) 300 MG tablet TAKE 1 TABLET BY MOUTH EVERYDAY AT BEDTIME 90 tablet 0  . Topiramate ER (TROKENDI XR) 50 MG CP24 Take 50 mg by mouth daily. 30 capsule 6  . VIVELLE-DOT 0.1 MG/24HR patch APPLY 1 PATCH TWICE WEEKLY  0  . zolmitriptan (ZOMIG-ZMT) 5 MG disintegrating tablet TAKE 1 TABLET ONCE A DAY AS NEEDED (Patient taking differently: as needed) 10 tablet 1   No current facility-administered medications on file prior to visit.     Allergies    Allergen Reactions  . Penicillins Rash    rash  . Rocephin [Ceftriaxone] Rash    Rash    Family History  Problem Relation Age of Onset  . Endometriosis Mother   . Hypertension Father   . Kidney cancer Father   . Thyroid cancer Father   . Cystic kidney disease Father   . Healthy Brother   . Healthy Brother   . Stomach cancer Neg Hx   . Colon cancer Neg Hx     Social History   Socioeconomic History  . Marital status: Married    Spouse name: Not on file  . Number of children: Not on file  . Years of education: Not on file  . Highest education level: Not on file  Occupational History  . Not on file  Social Needs  . Financial resource strain: Not on file  . Food insecurity:    Worry: Not on file    Inability: Not on file  . Transportation needs:    Medical: Not on file    Non-medical: Not on file  Tobacco Use  . Smoking status: Never Smoker  . Smokeless tobacco: Never Used  Substance and Sexual Activity  . Alcohol use: No  . Drug use: No  . Sexual activity: Yes    Partners: Male    Birth control/protection: Surgical    Comment: Hysterectomy  Lifestyle  . Physical activity:    Days per week: Not on file    Minutes per session: Not on file  . Stress: Not on file  Relationships  . Social connections:    Talks on phone: Not on file    Gets together: Not on file    Attends religious service: Not on file    Active member of club or organization: Not on file    Attends meetings of clubs or organizations: Not on file    Relationship status: Not on file  Other Topics Concern  . Not on file  Social History Narrative  . Not on file   Review of Systems - See HPI.  All other ROS are negative.  BP 102/70   Pulse 74   Temp 97.7 F (36.5 C) (Oral)   Resp 14   Ht 5\' 5"  (1.651 m)   Wt 125 lb (56.7 kg)   SpO2 99%   BMI 20.80 kg/m   Physical Exam  Constitutional: She is oriented to person, place, and time. She appears well-developed and well-nourished.  HENT:   Head: Normocephalic and atraumatic.  Neck: Neck supple.  Cardiovascular: Normal rate, regular rhythm, normal heart sounds and intact distal pulses.  Pulmonary/Chest: Effort normal and breath sounds normal. No stridor. No respiratory distress. She has no wheezes. She has no rales. She exhibits no tenderness.  Neurological: She is alert and oriented to person, place, and time.  Psychiatric: She has a normal mood and affect.  Vitals reviewed.  Assessment/Plan: 1. Acute bacterial bronchitis Intolerance to Doxycycline. Will d/c. Added to allergy list. Start Azithromycin. Continue prednisone burst, Tessalon and supportive measures reviewed at visit on Monday. Strict return precautions reviewed with patient.  - azithromycin (ZITHROMAX) 250 MG tablet; Take 2 tablets on Day 1. Then take 1 tablet daily.  Dispense: 6 tablet; Refill: 0   Piedad Climes, PA-C

## 2017-11-22 ENCOUNTER — Encounter: Payer: Self-pay | Admitting: Sports Medicine

## 2017-11-22 ENCOUNTER — Ambulatory Visit: Payer: Managed Care, Other (non HMO) | Admitting: Sports Medicine

## 2017-11-22 ENCOUNTER — Ambulatory Visit: Payer: Managed Care, Other (non HMO) | Admitting: Neurology

## 2017-11-22 VITALS — BP 102/70 | HR 83 | Ht 65.0 in | Wt 125.0 lb

## 2017-11-22 DIAGNOSIS — M542 Cervicalgia: Secondary | ICD-10-CM | POA: Diagnosis not present

## 2017-11-22 DIAGNOSIS — M9900 Segmental and somatic dysfunction of head region: Secondary | ICD-10-CM

## 2017-11-22 DIAGNOSIS — M9903 Segmental and somatic dysfunction of lumbar region: Secondary | ICD-10-CM

## 2017-11-22 DIAGNOSIS — M9908 Segmental and somatic dysfunction of rib cage: Secondary | ICD-10-CM

## 2017-11-22 DIAGNOSIS — Z8669 Personal history of other diseases of the nervous system and sense organs: Secondary | ICD-10-CM | POA: Diagnosis not present

## 2017-11-22 DIAGNOSIS — M9904 Segmental and somatic dysfunction of sacral region: Secondary | ICD-10-CM

## 2017-11-22 DIAGNOSIS — M9902 Segmental and somatic dysfunction of thoracic region: Secondary | ICD-10-CM | POA: Diagnosis not present

## 2017-11-22 DIAGNOSIS — M9901 Segmental and somatic dysfunction of cervical region: Secondary | ICD-10-CM

## 2017-11-22 NOTE — Progress Notes (Signed)
Brenda Macias. Brenda [facility]ias [facility] at [facility] [phone]  Brenda Macias - 47 y.o. female MRN [id number]  Date of birth: 11-14-1970  Visit Date: 11/22/2017  PCP: Waldon Merl, PA-C   Referred by: Waldon Merl, PA-C  Scribe(s) for today's visit: Stevenson Clinch, CMA  SUBJECTIVE:  Brenda Arnt "[name]" is here for Follow-up (neck pain)   HPI  03/07/17: Her LT-sided neck and shoulder pain symptoms INITIALLY: Began about 2 months ago and the is no known MOI.  Neck pain: Described as mild stabbing pain, radiating to the upper back, shoulder, and neck.  Worsened with turning head to the LT or RT, LT > RT. Pain is worse when lying down at night.  Improved with rest. She has been seen by a chiropractor in the past, years ago, and got some relief of pain associated with migraine HA.  Additional associated symptoms include: LT shoulder pain At this time symptoms are worsening compared to onset, severity is getting worse over time.  LT shoulder: Described as moderate-severe aching at rest but shooting stabbing pain with movement.  Worsened with movement.  Improved with rest.  Additional associated symptoms include: LT-sided neck pain.   05/14/17: Compared to the last office visit, her previously described symptoms are improving, 1/10 Current symptoms are mild & are nonradiating She has been taking Gabapentin 100 mg aPM. She is experiencing increased fatigue but its manageable.  She received OMT 04/16/17. Still has slight pain when turning head side-to-side, it had completely resolved but returned over the past few days.   06/11/17: Compared to the last office visit on 05/14/17, her previously described neck pain symptoms are improving w/ no pain noted and improved cervical ROM. Current symptoms are none-mild & are nonradiating She has not been taking Gabapentin due to being ill recently and not being able to hold food down.  She received  OMT on her last visit (05/14/17).  She has been doing her [employer] and states that they have been going well.  She has not done them over the past 1-2 weeks due to being ill.  07/24/2017: Compared to the last office visit on 06/11/17, her previously described neck symptoms are improving but feels like something's "off" because she's been waking up w/ headaches, almost to the point of a migraine.  The HA will then subside w/in approximately one hour of waking.  She also notes a "knot" in her R scapula. Current symptoms are mild & are nonradiating She is no longer taking the Gabapentin.  She is still doing the [employer] but not consistently.  09/10/2017: Compared to the last office visit, her previously described symptoms are improving, she reports minimal to no pain.  Current symptoms are mild & are non-radiating. She reports that the knot near her scapula has gone away.  She has been doing Goodman's exercises intermittently.   10/24/2017: Compared to the last office visit, her previously described symptoms are worsening. She c/o increased tightness/stiffness on the right side of her neck/shoulder. She c/o daily HA since the end of July. HA pain is 6/10 on average but can be more severe at times. She reports sensitivity to light and sound with HA.  Current symptoms are moderate & are non-radiating She has been taking migraine med prn.   11/05/2017 Compared to the last office visit, her previously described symptoms Daily headache has improved but after doing exercises the neck pain has increased. She was in the middle of  completing exercises and stopped due to tightness in neck. She then developed a migraine that was relived with her normal medication. Her daily headache has improved from last office visit but has had increased neck and top of right shoulder pain.  Current symptoms are moderate & are radiating to top of right shoulder.  She has been doing exercises, and medications.  NSAID  and stretches only done one time due to increased pain.   11/22/2017: Compared to the last office visit, her previously described symptoms show no change. Current symptoms are mild & are nonradiating She has been doing HEP. She has been prescribed a steroid for bronchitis over the past 1.5 weeks.    REVIEW OF SYSTEMS: Denies night time disturbances. Denies fevers, chills, or night sweats. Denies unexplained weight loss. Denies personal history of cancer. Denies changes in bowel or bladder habits. Denies recent unreported falls. Denies new or worsening dyspnea or wheezing. Reports headaches.  Denies numbness, tingling or weakness  In the extremities.  Denies dizziness or presyncopal episodes Denies lower extremity edema    HISTORY:  Prior history reviewed and updated per electronic medical record.  Social History   Occupational History  . Not on file  Tobacco Use  . Smoking status: Never Smoker  . Smokeless tobacco: Never Used  Substance and Sexual Activity  . Alcohol use: No  . Drug use: No  . Sexual activity: Yes    Partners: Male    Birth control/protection: Surgical    Comment: Hysterectomy   Social History   Social History Narrative  . Not on file    DATA OBTAINED & REVIEWED:  No results for input(s): HGBA1C, LABURIC, CREATINE in the last 8760 hours. . X-rays cervical spine 02/28/2017: Mild reversal of the normal cervical lordosis but with no significant degenerative changes. Marland Kitchen. MRI brain without contrast on 11/02/2013: Subtle foci of high signal that are mild and very subtle and nonspecific, . MRI of the brain with contrast - 11/02/2013 - no abnormal enhancement .    OBJECTIVE:  VS:  HT:5\' 5"  (165.1 cm)   WT:125 lb (56.7 kg)  BMI:20.8    BP:102/70  HR:83bpm  TEMP: ( )  RESP:98 %   PHYSICAL EXAM: CONSTITUTIONAL: Well-developed, Well-nourished and In no acute distress Psychiatric: Alert & appropriately interactive. and Not depressed or anxious  appearing. RESPIRATORY: No increased work of breathing and Trachea Midline EYES: Pupils are equal., EOM intact without nystagmus. and No scleral icterus.  Upper extremities: EXTREMITY EXAM: Warm and well perfused NEURO: unremarkable Normal associated myotomal distribution strength to manual muscle testing Normal sensation to light touch  MSK Exam: Neck and upper back: . Well aligned, no significant deformity. . No overlying skin changes. . TTP over Right periscapular musculature as well as cervicocranial junction. . Normal, non-painful Flexion and extension as well as rotation and side bending slightly limited.  Nonpainful except for with terminal motion. .  . Normal, non-painful: Compression test Lhermitte's compression test.  No pain with arm squeeze test . Positive/Abnormal: Some pain with cervical extension but no radicular symptoms.   ASSESSMENT   1. Neck pain   2. History of migraine   3. Somatic dysfunction of rib cage region   4. Somatic dysfunction of thoracic region   5. Somatic dysfunction of sacral region   6. Somatic dysfunction of cervical region   7. Somatic dysfunction of head region   8. Somatic dysfunction of lumbar region     PLAN:  Pertinent additional documentation may be included  in corresponding procedure notes, imaging studies, problem based documentation and patient instructions.  Procedures:  . Osteopathic manipulation was performed today based on physical exam findings.  Please see procedure note for further information including Osteopathic Exam findings  Medications:  No orders of the defined types were placed in this encounter.  Discussion/Instructions: No problem-specific Assessment & Plan notes found for this encounter.  . She is actually feeling quite well at this time given the fact she is on a steroid due to the acute bronchitis that she has.  She is feeling better overall from this and has been noticing less of the waves of  nausea. . She has been having some side effects to the increase in her migraine medication.  This does have been recently increased to 50 mg and is causing some side effects but the 25 mg were ineffective.  Could consider alternating 50 and 25 mg daily to see if this provided benefit with minimizing side effects.  She is meeting with Dr. Everlena Cooper next week and will discuss this option with him as well as alternative medicines but given that this has provided some relief in her headaches but side effects have been difficult and hesitant to recommend a change but will obviously defer to Dr. Moises Blood expertise. . Discussed red flag symptoms that warrant earlier emergent evaluation and patient voices understanding. . Activity modifications and the importance of avoiding exacerbating activities (limiting pain to no more than a 4 / 10 during or following activity) recommended and discussed.  Follow-up:  . Return in about 2 weeks (around 12/06/2017).   . If any lack of improvement consider: further diagnostic evaluation with MRI of the cervical spine and/or repeat MRI imaging of the brain.  . At follow up will plan to consider: repeat osteopathic manipulation     CMA/ATC served as scribe during this visit. History, Physical, and Plan performed by medical provider. Documentation and orders reviewed and attested to.      [name], DO    [facility]

## 2017-11-24 ENCOUNTER — Encounter: Payer: Self-pay | Admitting: Sports Medicine

## 2017-11-24 NOTE — Progress Notes (Signed)
PROCEDURE NOTE : OSTEOPATHIC MANIPULATION The decision today to treat with Osteopathic Manipulative Therapy (OMT) was based on physical exam findings. Verbal consent was obtained following a discussion with the patient regarding the of risks, benefits and potential side effects, including an acute pain flare,post manipulation soreness and need for repeat treatments. Additionally, we specifically discussed the minimal risk of  injury to neurovascular structures associated with Cervical manipulation.   Contraindications to OMT: NONE   Manipulation was performed as below: Regions Treated OMT Techniques Used  Cervical spine Thoracic spine Ribs Lumbar spine Sacrum Cranial HVLA muscle energy myofascial release counterstrain cranial   The patient tolerated the treatment well and reported Improved symptoms following treatment today. Patient was given medications, exercises, stretches and lifestyle modifications per AVS and verbally.   OSTEOPATHIC/STRUCTURAL EXAM:   OA - rotated right T2 -8 Neutral, Rotated LEFT, Sidebent RIGHT Rib 6 Right  Posterior L4 ERS left (Extended, Rotated & Sidebent) Left unilateral flexion SBS compression

## 2017-11-29 ENCOUNTER — Ambulatory Visit: Payer: Managed Care, Other (non HMO) | Admitting: Neurology

## 2017-11-29 DIAGNOSIS — G43709 Chronic migraine without aura, not intractable, without status migrainosus: Secondary | ICD-10-CM | POA: Diagnosis not present

## 2017-11-29 DIAGNOSIS — IMO0002 Reserved for concepts with insufficient information to code with codable children: Secondary | ICD-10-CM

## 2017-11-29 MED ORDER — TOPIRAMATE ER 25 MG PO CAP24: 25.0000 mg | ORAL_CAPSULE | Freq: Every day | ORAL | 3 refills | Status: DC

## 2017-11-29 MED ORDER — ONABOTULINUMTOXINA 100 UNITS IJ SOLR
200.0000 [IU] | Freq: Once | INTRAMUSCULAR | Status: AC
  Administered 2017-11-29: 200 [IU] via INTRAMUSCULAR

## 2017-11-29 NOTE — Progress Notes (Signed)
Patient increased Trokendi XR to 50mg  at bedtime.  Since then, she has had increased anxiety and fatigue.  We will decrease dose back to 25mg  at bedtime.

## 2017-11-29 NOTE — Progress Notes (Signed)
[facility]   Procedure Note Botox  Attending: Dr. Everlena Cooper  Preoperative Diagnosis(es): Chronic migraine  Consent obtained from: The patient Benefits discussed included, but were not limited to decreased muscle tightness, increased joint range of motion, and decreased pain.  Risk discussed included, but were not limited pain and discomfort, bleeding, bruising, excessive weakness, venous thrombosis, muscle atrophy and dysphagia.  Anticipated outcomes of the procedure as well as he risks and benefits of the alternatives to the procedure, and the roles and tasks of the personnel to be involved, were discussed with the patient, and the patient consents to the procedure and agrees to proceed. A copy of the patient medication guide was given to the patient which explains the blackbox warning.  Patients identity and treatment sites confirmed Yes.  .  Details of Procedure: Skin was cleaned with alcohol. Prior to injection, the needle plunger was aspirated to make sure the needle was not within a blood vessel.  There was no blood retrieved on aspiration.    Following is a summary of the muscles injected  And the amount of Botulinum toxin used:  Dilution 200 units of Botox was reconstituted with 4 ml of preservative free normal saline. Time of reconstitution: At the time of the office visit (<30 minutes prior to injection)   Injections  155 total units of Botox was injected with a 30 gauge needle.  Injection Sites: L occipitalis: 15 units- 3 sites  R occiptalis: 15 units- 3 sites  L upper trapezius: 15 units- 3 sites R upper trapezius: 15 units- 3 sits          L paraspinal: 10 units- 2 sites R paraspinal: 10 units- 2 sites  Face L frontalis(2 injection sites):10 units   R frontalis(2 injection sites):10 units         L corrugator: 5 units   R corrugator: 5 units           Procerus: 5 units   L temporalis: 20 units R temporalis: 20 units   Agent:  200 units of botulinum Type A  (Onobotulinum Toxin type A) was reconstituted with 4 ml of preservative free normal saline.  Time of reconstitution: At the time of the office visit (<30 minutes prior to injection)     Total injected (Units): 155  Total wasted (Units): No waste  Patient tolerated procedure well without complications.   Reinjection is anticipated in 3 months. Return to clinic in 4.5 months.

## 2017-11-29 NOTE — Progress Notes (Signed)
Pt was seen for Botox injections today. Per Dr Everlena Cooper, Pt needs Rx for 25 mg Trokendi

## 2017-12-02 ENCOUNTER — Encounter: Payer: Self-pay | Admitting: Sports Medicine

## 2017-12-02 ENCOUNTER — Ambulatory Visit: Payer: Managed Care, Other (non HMO) | Admitting: Sports Medicine

## 2017-12-02 VITALS — BP 102/70 | HR 83 | Ht 65.0 in | Wt 126.0 lb

## 2017-12-02 DIAGNOSIS — M9902 Segmental and somatic dysfunction of thoracic region: Secondary | ICD-10-CM

## 2017-12-02 DIAGNOSIS — M9901 Segmental and somatic dysfunction of cervical region: Secondary | ICD-10-CM

## 2017-12-02 DIAGNOSIS — Z8669 Personal history of other diseases of the nervous system and sense organs: Secondary | ICD-10-CM | POA: Diagnosis not present

## 2017-12-02 DIAGNOSIS — M542 Cervicalgia: Secondary | ICD-10-CM | POA: Diagnosis not present

## 2017-12-02 DIAGNOSIS — M9908 Segmental and somatic dysfunction of rib cage: Secondary | ICD-10-CM

## 2017-12-02 NOTE — Progress Notes (Signed)
Brenda Macias. [facility] at [facility] [phone]  Brenda Macias - 47 y.o. female MRN [id number]  Date of birth: [birth year]  Visit Date: 12/02/2017  PCP: Waldon Merl, PA-C   Referred by: Waldon Merl, PA-C  Scribe(s) for today's visit: Stevenson Clinch, CMA  SUBJECTIVE:  Brenda Macias is here for Follow-up (neck pain)   03/07/17: Her LT-sided neck and shoulder pain symptoms INITIALLY: Began about 2 months ago and the is no known MOI.  Neck pain: Described as mild stabbing pain, radiating to the upper back, shoulder, and neck.  Worsened with turning head to the LT or RT, LT > RT. Pain is worse when lying down at night.  Improved with rest. She has been seen by a chiropractor in the past, years ago, and got some relief of pain associated with migraine HA.  Additional associated symptoms include: LT shoulder pain At this time symptoms are worsening compared to onset, severity is getting worse over time.  LT shoulder: Described as moderate-severe aching at rest but shooting stabbing pain with movement.  Worsened with movement.  Improved with rest.  Additional associated symptoms include: LT-sided neck pain.   05/14/17: Compared to the last office visit, her previously described symptoms are improving, 1/10 Current symptoms are mild & are nonradiating She has been taking Gabapentin 100 mg aPM. She is experiencing increased fatigue but its manageable.  She received OMT 04/16/17. Still has slight pain when turning head side-to-side, it had completely resolved but returned over the past few days.   06/11/17: Compared to the last office visit on 05/14/17, her previously described neck pain symptoms are improving w/ no pain noted and improved cervical ROM. Current symptoms are none-mild & are nonradiating She has not been taking Gabapentin due to being ill recently and not being able to hold food down.  She received OMT on her last  visit (05/14/17).  She has been doing her [employer] and states that they have been going well.  She has not done them over the past 1-2 weeks due to being ill.  07/24/2017: Compared to the last office visit on 06/11/17, her previously described neck symptoms are improving but feels like something's "off" because she's been waking up w/ headaches, almost to the point of a migraine.  The HA will then subside w/in approximately one hour of waking.  She also notes a "knot" in her R scapula. Current symptoms are mild & are nonradiating She is no longer taking the Gabapentin.  She is still doing the [employer] but not consistently.  09/10/2017: Compared to the last office visit, her previously described symptoms are improving, she reports minimal to no pain.  Current symptoms are mild & are non-radiating. She reports that the knot near her scapula has gone away.  She has been doing Goodman's exercises intermittently.   10/24/2017: Compared to the last office visit, her previously described symptoms are worsening. She c/o increased tightness/stiffness on the right side of her neck/shoulder. She c/o daily HA since the end of July. HA pain is 6/10 on average but can be more severe at times. She reports sensitivity to light and sound with HA.  Current symptoms are moderate & are non-radiating She has been taking migraine med prn.   11/05/2017 Compared to the last office visit, her previously described symptoms Daily headache has improved but after doing exercises the neck pain has increased. She was in the middle of completing exercises and  stopped due to tightness in neck. She then developed a migraine that was relived with her normal medication. Her daily headache has improved from last office visit but has had increased neck and top of right shoulder pain.  Current symptoms are moderate & are radiating to top of right shoulder.  She has been doing exercises, and medications.  NSAID and stretches  only done one time due to increased pain.   11/22/2017: Compared to the last office visit, her previously described symptoms show no change. Current symptoms are mild & are nonradiating She has been doing HEP. She has been prescribed a steroid for bronchitis over the past 1.5 weeks.   12/02/2017: Compared to the last office visit on 11/22/17, her previously described neck pain symptoms are improving, noting an 80% improvement.  Pt states that she has a HA today and had a migraine last Thursday. Current symptoms are less than mild & are nonradiating She has been doing her HEP.  She has been getting regular OMT treatments and has been responding well to these.   REVIEW OF SYSTEMS: Denies night time disturbances. Denies fevers, chills, or night sweats. Denies unexplained weight loss. Denies personal history of cancer. Denies changes in bowel or bladder habits. Denies recent unreported falls. Denies new or worsening dyspnea or wheezing. Reports headaches.  Denies numbness, tingling or weakness  In the extremities.  Denies dizziness or presyncopal episodes Denies lower extremity edema    HISTORY:  Prior history reviewed and updated per electronic medical record.  Social History   Occupational History  . Not on file  Tobacco Use  . Smoking status: Never Smoker  . Smokeless tobacco: Never Used  Substance and Sexual Activity  . Alcohol use: No  . Drug use: No  . Sexual activity: Yes    Partners: Male    Birth control/protection: Surgical    Comment: Hysterectomy   Social History   Social History Narrative  . Not on file    DATA OBTAINED & REVIEWED:  No results for input(s): HGBA1C, LABURIC, CREATINE in the last 8760 hours. . X-rays cervical spine 02/28/2017: Mild reversal of the normal cervical lordosis but with no significant degenerative changes. Marland Kitchen MRI brain without contrast on 11/02/2013: Subtle foci of high signal that are mild and very subtle and nonspecific, . MRI  of the brain with contrast - 11/02/2013 - no abnormal enhancement .    OBJECTIVE:  VS:  HT:5\' 5"  (165.1 cm)   WT:126 lb (57.2 kg)  BMI:20.97    BP:102/70  HR:83bpm  TEMP: ( )  RESP:98 %   PHYSICAL EXAM: CONSTITUTIONAL: Well-developed, Well-nourished and In no acute distress Psychiatric: Alert & appropriately interactive. and Not depressed or anxious appearing. RESPIRATORY: No increased work of breathing and Trachea Midline EYES: Pupils are equal., EOM intact without nystagmus. and No scleral icterus.  Upper extremities: EXTREMITY EXAM: Warm and well perfused NEURO: unremarkable  MSK Exam: Neck and upper back: . Well aligned, no significant deformity. . No overlying skin changes. . TTP over Right periscapular musculature as well as cervicocranial junction. . Normal, non-painful Flexion and extension as well as rotation and side bending slightly limited.  Nonpainful except for with terminal motion. .  . Normal, non-painful: Compression test Lhermitte's compression test.  No pain with arm squeeze test . Positive/Abnormal: Some pain with cervical extension but no radicular symptoms.   ASSESSMENT   1. Neck pain   2. History of migraine   3. Somatic dysfunction of cervical region   4.  Somatic dysfunction of thoracic region   5. Somatic dysfunction of rib cage region     PLAN:  Pertinent additional documentation may be included in corresponding procedure notes, imaging studies, problem based documentation and patient instructions.  Procedures:  . Osteopathic manipulation was performed today based on physical exam findings.  Please see procedure note for further information including Osteopathic Exam findings    Medications:  No orders of the defined types were placed in this encounter.  Discussion/Instructions: No problem-specific Assessment & Plan notes found for this encounter.  . Discussed red flag symptoms that warrant earlier emergent evaluation and patient voices  understanding. . Activity modifications and the importance of avoiding exacerbating activities (limiting pain to no more than a 4 / 10 during or following activity) recommended and discussed.  Follow-up:  . No follow-ups on file.   . At follow up will plan to consider: repeat osteopathic manipulation     CMA/ATC served as scribe during this visit. History, Physical, and Plan performed by medical provider. Documentation and orders reviewed and attested to.      [name], DO    [facility]

## 2017-12-05 ENCOUNTER — Other Ambulatory Visit: Payer: Self-pay | Admitting: Gastroenterology

## 2017-12-05 DIAGNOSIS — K2950 Unspecified chronic gastritis without bleeding: Secondary | ICD-10-CM

## 2017-12-06 ENCOUNTER — Ambulatory Visit: Payer: Managed Care, Other (non HMO) | Admitting: Sports Medicine

## 2017-12-16 ENCOUNTER — Encounter: Payer: Self-pay | Admitting: Sports Medicine

## 2017-12-16 ENCOUNTER — Ambulatory Visit: Payer: Managed Care, Other (non HMO) | Admitting: Sports Medicine

## 2017-12-16 VITALS — BP 100/76 | HR 80 | Ht 65.0 in | Wt 127.6 lb

## 2017-12-16 DIAGNOSIS — M542 Cervicalgia: Secondary | ICD-10-CM

## 2017-12-16 DIAGNOSIS — M9902 Segmental and somatic dysfunction of thoracic region: Secondary | ICD-10-CM

## 2017-12-16 DIAGNOSIS — M9901 Segmental and somatic dysfunction of cervical region: Secondary | ICD-10-CM | POA: Diagnosis not present

## 2017-12-16 DIAGNOSIS — M9904 Segmental and somatic dysfunction of sacral region: Secondary | ICD-10-CM

## 2017-12-16 DIAGNOSIS — M9905 Segmental and somatic dysfunction of pelvic region: Secondary | ICD-10-CM

## 2017-12-16 DIAGNOSIS — M9900 Segmental and somatic dysfunction of head region: Secondary | ICD-10-CM

## 2017-12-16 DIAGNOSIS — Z8669 Personal history of other diseases of the nervous system and sense organs: Secondary | ICD-10-CM

## 2017-12-16 DIAGNOSIS — M9903 Segmental and somatic dysfunction of lumbar region: Secondary | ICD-10-CM

## 2017-12-16 DIAGNOSIS — M9908 Segmental and somatic dysfunction of rib cage: Secondary | ICD-10-CM

## 2017-12-16 NOTE — Progress Notes (Signed)
Brenda Macias. [name] [facility] at [facility] [phone]  Brenda Macias - 47 y.o. female MRN [id number]  Date of birth: 01-27-1971  Visit Date: 12/16/2017  PCP: Waldon Merl, PA-C   Referred by: Waldon Merl, PA-C  Scribe(s) for today's visit: Christoper Fabian, LAT, ATC  SUBJECTIVE:  Brenda Arnt "[name]" is here for Follow-up (neck pain)   HPI  03/07/17: Her LT-sided neck and shoulder pain symptoms INITIALLY: Began about 2 months ago and the is no known MOI.  Neck pain: Described as mild stabbing pain, radiating to the upper back, shoulder, and neck.  Worsened with turning head to the LT or RT, LT > RT. Pain is worse when lying down at night.  Improved with rest. She has been seen by a chiropractor in the past, years ago, and got some relief of pain associated with migraine HA.  Additional associated symptoms include: LT shoulder pain At this time symptoms are worsening compared to onset, severity is getting worse over time.  LT shoulder: Described as moderate-severe aching at rest but shooting stabbing pain with movement.  Worsened with movement.  Improved with rest.  Additional associated symptoms include: LT-sided neck pain.   05/14/17: Compared to the last office visit, her previously described symptoms are improving, 1/10 Current symptoms are mild & are nonradiating She has been taking Gabapentin 100 mg aPM. She is experiencing increased fatigue but its manageable.  She received OMT 04/16/17. Still has slight pain when turning head side-to-side, it had completely resolved but returned over the past few days.   06/11/17: Compared to the last office visit on 05/14/17, her previously described neck pain symptoms are improving w/ no pain noted and improved cervical ROM. Current symptoms are none-mild & are nonradiating She has not been taking Gabapentin due to being ill recently and not being able to hold food down.  She received  OMT on her last visit (05/14/17).  She has been doing her [employer] and states that they have been going well.  She has not done them over the past 1-2 weeks due to being ill.  07/24/2017: Compared to the last office visit on 06/11/17, her previously described neck symptoms are improving but feels like something's "off" because she's been waking up w/ headaches, almost to the point of a migraine.  The HA will then subside w/in approximately one hour of waking.  She also notes a "knot" in her R scapula. Current symptoms are mild & are nonradiating She is no longer taking the Gabapentin.  She is still doing the [employer] but not consistently.  09/10/2017: Compared to the last office visit, her previously described symptoms are improving, she reports minimal to no pain.  Current symptoms are mild & are non-radiating. She reports that the knot near her scapula has gone away.  She has been doing Goodman's exercises intermittently.   10/24/2017: Compared to the last office visit, her previously described symptoms are worsening. She c/o increased tightness/stiffness on the right side of her neck/shoulder. She c/o daily HA since the end of July. HA pain is 6/10 on average but can be more severe at times. She reports sensitivity to light and sound with HA.  Current symptoms are moderate & are non-radiating She has been taking migraine med prn.   11/05/2017 Compared to the last office visit, her previously described symptoms Daily headache has improved but after doing exercises the neck pain has increased. She was in the middle  of completing exercises and stopped due to tightness in neck. She then developed a migraine that was relived with her normal medication. Her daily headache has improved from last office visit but has had increased neck and top of right shoulder pain.  Current symptoms are moderate & are radiating to top of right shoulder.  She has been doing exercises, and medications.  NSAID  and stretches only done one time due to increased pain.   11/22/2017: Compared to the last office visit, her previously described symptoms show no change. Current symptoms are mild & are nonradiating She has been doing HEP. She has been prescribed a steroid for bronchitis over the past 1.5 weeks.   12/02/2017: Compared to the last office visit on 11/22/17, her previously described neck pain symptoms are improving, noting an 80% improvement.  Pt states that she has a HA today and had a migraine last Thursday. Current symptoms are less than mild & are nonradiating She has been doing her HEP.  She has been getting regular OMT treatments and has been responding well to these.  12/16/2017: Compared to the last office visit on 12/02/17, her previously described neck symptoms are improving.  Pt states that she con't to use her Craniocradle.  States that she has a HA today but hasn't had a migraine in over a week. Current symptoms are less than mild & are nonradiating She has been doing her HEP and getting regular OMT treatments which have been helping her symptoms.  C-spine XR - 02/28/2017 Brain MRI - 11/13/2013  REVIEW OF SYSTEMS: Denies night time disturbances. Denies fevers, chills, or night sweats. Denies unexplained weight loss. Denies personal history of cancer. Denies changes in bowel or bladder habits. Denies recent unreported falls. Denies new or worsening dyspnea or wheezing. Reports headaches. Has hx of migraines Denies numbness, tingling or weakness  In the extremities.  Denies dizziness or presyncopal episodes Denies lower extremity edema    HISTORY:  Prior history reviewed and updated per electronic medical record.  Social History   Occupational History  . Not on file  Tobacco Use  . Smoking status: Never Smoker  . Smokeless tobacco: Never Used  Substance and Sexual Activity  . Alcohol use: No  . Drug use: No  . Sexual activity: Yes    Partners: Male    Birth  control/protection: Surgical    Comment: Hysterectomy   Social History   Social History Narrative  . Not on file    DATA OBTAINED & REVIEWED:  No results for input(s): HGBA1C, LABURIC, CREATINE in the last 8760 hours. . X-rays cervical spine 02/28/2017: Mild reversal of the normal cervical lordosis but with no significant degenerative changes. [facility]land Kitchen MRI brain without contrast on 11/02/2013: Subtle foci of high signal that are mild and very subtle and nonspecific, . MRI of the brain with contrast - 11/02/2013 - no abnormal enhancement .    OBJECTIVE:  VS:  HT:5\' 5"  (165.1 cm)   WT:127 lb 9.6 oz (57.9 kg)  BMI:21.23    BP:100/76  HR:80bpm  TEMP: ( )  RESP:99 %   PHYSICAL EXAM: CONSTITUTIONAL: Well-developed, Well-nourished and In no acute distress Psychiatric: Alert & appropriately interactive. and Not depressed or anxious appearing. RESPIRATORY: No increased work of breathing and Trachea Midline EYES: Pupils are equal., EOM intact without nystagmus. and No scleral icterus.  Upper extremities: EXTREMITY EXAM: Warm and well perfused NEURO: unremarkable Normal associated myotomal distribution strength to manual muscle testing Normal sensation to light touch  MSK Exam:  Neck and upper back: . Well aligned, no significant deformity. . No overlying skin changes. . TTP over Right periscapular musculature as well as cervicocranial junction. . Normal, non-painful Flexion and extension as well as rotation and side bending slightly limited.  Nonpainful except for with terminal motion. .  . Normal, non-painful: Compression test Lhermitte's compression test.  No pain with arm squeeze test . Positive/Abnormal: Some pain with cervical extension but no radicular symptoms.   ASSESSMENT   1. Neck pain   2. History of migraine   3. Somatic dysfunction of cervical region   4. Somatic dysfunction of thoracic region   5. Somatic dysfunction of lumbar region   6. Somatic dysfunction of rib  cage region   7. Somatic dysfunction of pelvis region   8. Somatic dysfunction of sacral region   9. Somatic dysfunction of head region     PLAN:  Pertinent additional documentation may be included in corresponding procedure notes, imaging studies, problem based documentation and patient instructions.  Procedures:  . Osteopathic manipulation was performed today based on physical exam findings.  Please see procedure note for further information including Osteopathic Exam findings  Medications:  No orders of the defined types were placed in this encounter.  Discussion/Instructions: No problem-specific Assessment & Plan notes found for this encounter.  Peri [place] improvement with the wavelike nausea that she was experiencing with her headaches.  She has been over a week without a migraine. . She continues to show significant improvements in soft tissue tightness and symptoms especially since we have been working on craniosacral aspects. . Discussed red flag symptoms that warrant earlier emergent evaluation and patient voices understanding. . Activity modifications and the importance of avoiding exacerbating activities (limiting pain to no more than a 4 / 10 during or following activity) recommended and discussed.  Follow-up:  . Return in about 2 weeks (around 12/30/2017) for consideration of repeat Osteopathic Manipulation.   . If any lack of improvement consider: further diagnostic evaluation with MRI of the cervical spine and/or repeat MRI imaging of the brain.  . At follow up will plan to consider: repeat osteopathic manipulation     CMA/ATC served as scribe during this visit. History, Physical, and Plan performed by medical provider. Documentation and orders reviewed and attested to.      [name], DO    [facility]

## 2017-12-16 NOTE — Progress Notes (Signed)
PROCEDURE NOTE : OSTEOPATHIC MANIPULATION The decision today to treat with Osteopathic Manipulative Therapy (OMT) was based on physical exam findings. Verbal consent was obtained following a discussion with the patient regarding the of risks, benefits and potential side effects, including an acute pain flare,post manipulation soreness and need for repeat treatments. Previously discussed cervical risks.   Contraindications to OMT: NONE   Manipulation was performed as below: Regions Treated OMT Techniques Used  Cervical spine Thoracic spine Ribs Lumbar spine Pelvis Sacrum Cranial HVLA muscle energy myofascial release articulatory counterstrain cranial facilitated positional release   The patient tolerated the treatment well and reported Improved symptoms following treatment today. Patient was given medications, exercises, stretches and lifestyle modifications per AVS and verbally.   OSTEOPATHIC/STRUCTURAL EXAM:   OA - rotated right C3 ERS left (Extended, Rotated & Sidebent) T2 -8 Neutral, Rotated LEFT, Sidebent RIGHT Rib 6 Right  Posterior L4 FRS left (Flexed, Rotated & Sidebent) Left psoas spasm Left anterior innonimate Left unilateral flexion SBS compression  

## 2017-12-30 ENCOUNTER — Ambulatory Visit: Payer: Managed Care, Other (non HMO) | Admitting: Sports Medicine

## 2017-12-30 ENCOUNTER — Encounter: Payer: Self-pay | Admitting: Sports Medicine

## 2017-12-30 VITALS — BP 96/70 | HR 92 | Ht 65.0 in | Wt 126.0 lb

## 2017-12-30 DIAGNOSIS — M542 Cervicalgia: Secondary | ICD-10-CM

## 2017-12-30 DIAGNOSIS — M9901 Segmental and somatic dysfunction of cervical region: Secondary | ICD-10-CM | POA: Diagnosis not present

## 2017-12-30 DIAGNOSIS — Z8669 Personal history of other diseases of the nervous system and sense organs: Secondary | ICD-10-CM | POA: Diagnosis not present

## 2017-12-30 DIAGNOSIS — M9902 Segmental and somatic dysfunction of thoracic region: Secondary | ICD-10-CM | POA: Diagnosis not present

## 2017-12-30 DIAGNOSIS — M9905 Segmental and somatic dysfunction of pelvic region: Secondary | ICD-10-CM

## 2017-12-30 DIAGNOSIS — M9904 Segmental and somatic dysfunction of sacral region: Secondary | ICD-10-CM

## 2017-12-30 DIAGNOSIS — M9908 Segmental and somatic dysfunction of rib cage: Secondary | ICD-10-CM

## 2017-12-30 DIAGNOSIS — M9903 Segmental and somatic dysfunction of lumbar region: Secondary | ICD-10-CM

## 2017-12-30 NOTE — Patient Instructions (Signed)
Pilates PT - [facility]  [facility]

## 2017-12-30 NOTE — Progress Notes (Signed)
Brenda Macias. [name] [phone]  Sher Shampine - 47 y.o. female MRN [id number]  Date of birth: 10-Feb-1971  Visit Date: 12/30/2017  PCP: Waldon Merl, PA-C   Referred by: Waldon Merl, PA-C  Scribe(s) for today's visit: Stevenson Clinch, CMA  SUBJECTIVE:  Brenda Macias is here for Follow-up (neck pain)  03/07/17: Her LT-sided neck and shoulder pain symptoms INITIALLY: Began about 2 months ago and the is no known MOI.  Neck pain: Described as mild stabbing pain, radiating to the upper back, shoulder, and neck.  Worsened with turning head to the LT or RT, LT > RT. Pain is worse when lying down at night.  Improved with rest. She has been seen by a chiropractor in the past, years ago, and got some relief of pain associated with migraine HA.  Additional associated symptoms include: LT shoulder pain At this time symptoms are worsening compared to onset, severity is getting worse over time.  LT shoulder: Described as moderate-severe aching at rest but shooting stabbing pain with movement.  Worsened with movement.  Improved with rest.  Additional associated symptoms include: LT-sided neck pain.   05/14/17: Compared to the last office visit, her previously described symptoms are improving, 1/10 Current symptoms are mild & are nonradiating She has been taking Gabapentin 100 mg aPM. She is experiencing increased fatigue but its manageable.  She received OMT 04/16/17. Still has slight pain when turning head side-to-side, it had completely resolved but returned over the past few days.   06/11/17: Compared to the last office visit on 05/14/17, her previously described neck pain symptoms are improving w/ no pain noted and improved cervical ROM. Current symptoms are none-mild & are nonradiating She has not been taking Gabapentin due to being ill recently and not being able to hold food down.  She received OMT on her last  visit (05/14/17).  She has been doing her [employer] and states that they have been going well.  She has not done them over the past 1-2 weeks due to being ill.  07/24/2017: Compared to the last office visit on 06/11/17, her previously described neck symptoms are improving but feels like something's "off" because she's been waking up w/ headaches, almost to the point of a migraine.  The HA will then subside w/in approximately one hour of waking.  She also notes a "knot" in her R scapula. Current symptoms are mild & are nonradiating She is no longer taking the Gabapentin.  She is still doing the [employer] but not consistently.  09/10/2017: Compared to the last office visit, her previously described symptoms are improving, she reports minimal to no pain.  Current symptoms are mild & are non-radiating. She reports that the knot near her scapula has gone away.  She has been doing Goodman's exercises intermittently.   10/24/2017: Compared to the last office visit, her previously described symptoms are worsening. She c/o increased tightness/stiffness on the right side of her neck/shoulder. She c/o daily HA since the end of July. HA pain is 6/10 on average but can be more severe at times. She reports sensitivity to light and sound with HA.  Current symptoms are moderate & are non-radiating She has been taking migraine med prn.   11/05/2017 Compared to the last office visit, her previously described symptoms Daily headache has improved but after doing exercises the neck pain has increased. She was in the middle of completing exercises and stopped  due to tightness in neck. She then developed a migraine that was relived with her normal medication. Her daily headache has improved from last office visit but has had increased neck and top of right shoulder pain.  Current symptoms are moderate & are radiating to top of right shoulder.  She has been doing exercises, and medications.  NSAID and stretches  only done one time due to increased pain.   11/22/2017: Compared to the last office visit, her previously described symptoms show no change. Current symptoms are mild & are nonradiating She has been doing HEP. She has been prescribed a steroid for bronchitis over the past 1.5 weeks.   12/02/2017: Compared to the last office visit on 11/22/17, her previously described neck pain symptoms are improving, noting an 80% improvement.  Pt states that she has a HA today and had a migraine last Thursday. Current symptoms are less than mild & are nonradiating She has been doing her HEP.  She has been getting regular OMT treatments and has been responding well to these.  12/16/2017: Compared to the last office visit on 12/02/17, her previously described neck symptoms are improving.  Pt states that she con't to use her Craniocradle.  States that she has a HA today but hasn't had a migraine in over a week. Current symptoms are less than mild & are nonradiating She has been doing her HEP and getting regular OMT treatments which have been helping her symptoms. C-spine XR - 02/28/2017 Brain MRI - 11/13/2013  12/30/2017: Compared to the last office visit, her previously described symptoms are improving. She reports improvement with HA.  Current symptoms are mild-no pain & are nonradiating She has been doing HEP with no trouble. She continues to use craniocradle. She has also started doing Yoga. She reports good response to OMT.   REVIEW OF SYSTEMS: Denies night time disturbances. Denies fevers, chills, or night sweats. Denies unexplained weight loss. Denies personal history of cancer. Denies changes in bowel or bladder habits. Denies recent unreported falls. Denies new or worsening dyspnea or wheezing. Reports headaches. Has hx of migraines Denies numbness, tingling or weakness  In the extremities.  Denies dizziness or presyncopal episodes Denies lower extremity edema    HISTORY:  Prior history  reviewed and updated per electronic medical record.  Social History   Occupational History  . Not on file  Tobacco Use  . Smoking status: Never Smoker  . Smokeless tobacco: Never Used  Substance and Sexual Activity  . Alcohol use: No  . Drug use: No  . Sexual activity: Yes    Partners: Male    Birth control/protection: Surgical    Comment: Hysterectomy   Social History   Social History Narrative  . Not on file    DATA OBTAINED & REVIEWED:  No results for input(s): HGBA1C, LABURIC, CREATINE in the last 8760 hours. . X-rays cervical spine 02/28/2017: Mild reversal of the normal cervical lordosis but with no significant degenerative changes. Marland Kitchen MRI brain without contrast on 11/02/2013: Subtle foci of high signal that are mild and very subtle and nonspecific, . MRI of the brain with contrast - 11/02/2013 - no abnormal enhancement  OBJECTIVE:  VS:  HT:5\' 5"  (165.1 cm)   WT:126 lb (57.2 kg)  BMI:20.97    BP:96/70  HR:92bpm  TEMP: ( )  RESP:99 %   PHYSICAL EXAM: CONSTITUTIONAL: Well-developed, Well-nourished and In no acute distress Psychiatric: Alert & appropriately interactive. and Not depressed or anxious appearing. RESPIRATORY: No increased work of breathing  and Trachea Midline EYES: Pupils are equal., EOM intact without nystagmus. and No scleral icterus.  Upper extremities: EXTREMITY EXAM: Warm and well perfused NEURO: unremarkable  MSK Exam: Neck and upper back: . Well aligned, no significant deformity. . No overlying skin changes. . TTP over Right periscapular musculature as well as cervicocranial junction. . Normal, non-painful Flexion and extension as well as rotation and side bending slightly limited.  Nonpainful except for with terminal motion.  . Normal, non-painful: Compression test Lhermitte's compression test.  No pain with arm squeeze test . Positive/Abnormal: Some pain with cervical extension but no radicular symptoms.   ASSESSMENT   1. Neck pain     2. History of migraine   3. Somatic dysfunction of cervical region   4. Somatic dysfunction of thoracic region   5. Somatic dysfunction of lumbar region   6. Somatic dysfunction of rib cage region   7. Somatic dysfunction of pelvis region   8. Somatic dysfunction of sacral region     PLAN:  Pertinent additional documentation may be included in corresponding procedure notes, imaging studies, problem based documentation and patient instructions.  Procedures:  Osteopathic manipulation was performed today based on physical exam findings.  Please see procedure note for further information including Osteopathic Exam findings   Medications:  No orders of the defined types were placed in this encounter.  Discussion/Instructions: No problem-specific Assessment & Plan notes found for this encounter.  Peri Jefferson improvement with the wavelike nausea that she was experiencing with her headaches.  She has been over a week without a migraine. . We additionally discussed enrolling and Pilates as a form of therapeutic exercise. . She continues to show significant improvements in soft tissue tightness and symptoms especially since we have been working on craniosacral aspects. . Discussed red flag symptoms that warrant earlier emergent evaluation and patient voices understanding. . Activity modifications and the importance of avoiding exacerbating activities (limiting pain to no more than a 4 / 10 during or following activity) recommended and discussed.  Follow-up:  . Return in about 4 weeks (around 01/27/2018).   . If any lack of improvement consider: consider further diagnostic evaluation with MRI of the cervical spine and/or repeat MRI imaging of the brain.  . At follow up will plan to consider:  osteopathic manipulation     CMA/ATC served as scribe during this visit. History, Physical, and Plan performed by medical provider. Documentation and orders reviewed and attested to.      [name], DO    [facility]

## 2017-12-30 NOTE — Progress Notes (Signed)
PROCEDURE NOTE : OSTEOPATHIC MANIPULATION The decision today to treat with Osteopathic Manipulative Therapy (OMT) was based on physical exam findings. Verbal consent was obtained following a discussion with the patient regarding the of risks, benefits and potential side effects, including an acute pain flare,post manipulation soreness and need for repeat treatments. Previously discussed cervical risks.   Contraindications to OMT: NONE   Manipulation was performed as below: Regions Treated OMT Techniques Used  Cervical spine Thoracic spine Ribs Lumbar spine Pelvis Sacrum HVLA muscle energy myofascial release articulatory counterstrain cranial facilitated positional release   The patient tolerated the treatment well and reported Improved symptoms following treatment today. Patient was given medications, exercises, stretches and lifestyle modifications per AVS and verbally.   OSTEOPATHIC/STRUCTURAL EXAM:   OA - rotated right C3 ERS left (Extended, Rotated & Sidebent) T2 -8 Neutral, Rotated LEFT, Sidebent RIGHT Rib 6 Right  Posterior L4 FRS left (Flexed, Rotated & Sidebent) Left psoas spasm Left anterior innonimate Left unilateral flexion

## 2018-01-23 ENCOUNTER — Other Ambulatory Visit: Payer: Self-pay | Admitting: Physician Assistant

## 2018-01-23 NOTE — Telephone Encounter (Signed)
Would recommend we switch to another H2 Blocker like Pepcid due to the recent dilemma over Zantac. Is she ok with this change? If so I will send in Rx for alternative for her to take as directed.

## 2018-01-24 NOTE — Telephone Encounter (Signed)
LMOVM advising we can switch her PPI to H2 blocker to Pepcid.  Due to the rumor of Zantac can cause Cancer. If she is agreeble can switch medications

## 2018-01-27 ENCOUNTER — Ambulatory Visit: Payer: Managed Care, Other (non HMO) | Admitting: Sports Medicine

## 2018-01-27 ENCOUNTER — Encounter: Payer: Self-pay | Admitting: Sports Medicine

## 2018-01-27 VITALS — BP 110/82 | HR 86 | Ht 65.0 in | Wt 126.0 lb

## 2018-01-27 DIAGNOSIS — M9904 Segmental and somatic dysfunction of sacral region: Secondary | ICD-10-CM

## 2018-01-27 DIAGNOSIS — Z8669 Personal history of other diseases of the nervous system and sense organs: Secondary | ICD-10-CM

## 2018-01-27 DIAGNOSIS — M542 Cervicalgia: Secondary | ICD-10-CM

## 2018-01-27 DIAGNOSIS — M9901 Segmental and somatic dysfunction of cervical region: Secondary | ICD-10-CM | POA: Diagnosis not present

## 2018-01-27 DIAGNOSIS — M9902 Segmental and somatic dysfunction of thoracic region: Secondary | ICD-10-CM

## 2018-01-27 DIAGNOSIS — M9905 Segmental and somatic dysfunction of pelvic region: Secondary | ICD-10-CM

## 2018-01-27 DIAGNOSIS — M9903 Segmental and somatic dysfunction of lumbar region: Secondary | ICD-10-CM

## 2018-01-27 DIAGNOSIS — M9900 Segmental and somatic dysfunction of head region: Secondary | ICD-10-CM

## 2018-01-27 DIAGNOSIS — M9908 Segmental and somatic dysfunction of rib cage: Secondary | ICD-10-CM

## 2018-01-27 NOTE — Progress Notes (Signed)
Brenda Macias. Brenda Macias [facility] [phone]  Brenda Macias - 47 y.o. female MRN [id number]  Date of birth: October 17, 1970  Visit Date: 01/27/2018  PCP: Waldon Merl, PA-C   Referred by: Waldon Merl, PA-C  Scribe(s) for today's visit: Stevenson Clinch, CMA  SUBJECTIVE:  Brenda Macias "Brenda Macias" is here for Follow-up (neck pain)   HPI: 03/07/17: Her LT-sided neck and shoulder pain symptoms INITIALLY: Began about 2 months ago and the is no known MOI.  Neck pain: Described as mild stabbing pain, radiating to the upper back, shoulder, and neck.  Worsened with turning head to the LT or RT, LT > RT. Pain is worse when lying down at night.  Improved with rest. She has been seen by a chiropractor in the past, years ago, and got some relief of pain associated with migraine HA.  Additional associated symptoms include: LT shoulder pain At this time symptoms are worsening compared to onset, severity is getting worse over time.  LT shoulder: Described as moderate-severe aching at rest but shooting stabbing pain with movement.  Worsened with movement.  Improved with rest.  Additional associated symptoms include: LT-sided neck pain.   05/14/17: Compared to the last office visit, her previously described symptoms are improving, 1/10 Current symptoms are mild & are nonradiating She has been taking Gabapentin 100 mg aPM. She is experiencing increased fatigue but its manageable.  She received OMT 04/16/17. Still has slight pain when turning head side-to-side, it had completely resolved but returned over the past few days.   06/11/17: Compared to the last office visit on 05/14/17, her previously described neck pain symptoms are improving w/ no pain noted and improved cervical ROM. Current symptoms are none-mild & are nonradiating She has not been taking Gabapentin due to being ill recently and not being able to hold food down.  She received  OMT on her last visit (05/14/17).  She has been doing her [employer] and states that they have been going well.  She has not done them over the past 1-2 weeks due to being ill.  07/24/2017: Compared to the last office visit on 06/11/17, her previously described neck symptoms are improving but feels like something's "off" because she's been waking up w/ headaches, almost to the point of a migraine.  The HA will then subside w/in approximately one hour of waking.  She also notes a "knot" in her R scapula. Current symptoms are mild & are nonradiating She is no longer taking the Gabapentin.  She is still doing the [employer] but not consistently.  09/10/2017: Compared to the last office visit, her previously described symptoms are improving, she reports minimal to no pain.  Current symptoms are mild & are non-radiating. She reports that the knot near her scapula has gone away.  She has been doing Goodman's exercises intermittently.   10/24/2017: Compared to the last office visit, her previously described symptoms are worsening. She c/o increased tightness/stiffness on the right side of her neck/shoulder. She c/o daily HA since the end of July. HA pain is 6/10 on average but can be more severe at times. She reports sensitivity to light and sound with HA.  Current symptoms are moderate & are non-radiating She has been taking migraine med prn.   11/05/2017 Compared to the last office visit, her previously described symptoms Daily headache has improved but after doing exercises the neck pain has increased. She was in the middle of completing  exercises and stopped due to tightness in neck. She then developed a migraine that was relived with her normal medication. Her daily headache has improved from last office visit but has had increased neck and top of right shoulder pain.  Current symptoms are moderate & are radiating to top of right shoulder.  She has been doing exercises, and medications.  NSAID  and stretches only done one time due to increased pain.   11/22/2017: Compared to the last office visit, her previously described symptoms show no change. Current symptoms are mild & are nonradiating She has been doing HEP. She has been prescribed a steroid for bronchitis over the past 1.5 weeks.   12/02/2017: Compared to the last office visit on 11/22/17, her previously described neck pain symptoms are improving, noting an 80% improvement.  Pt states that she has a HA today and had a migraine last Thursday. Current symptoms are less than mild & are nonradiating She has been doing her HEP.  She has been getting regular OMT treatments and has been responding well to these.  12/16/2017: Compared to the last office visit on 12/02/17, her previously described neck symptoms are improving.  Pt states that she con't to use her Craniocradle.  States that she has a HA today but hasn't had a migraine in over a week. Current symptoms are less than mild & are nonradiating She has been doing her HEP and getting regular OMT treatments which have been helping her symptoms. C-spine XR - 02/28/2017 Brain MRI - 11/13/2013  12/30/2017: Compared to the last office visit, her previously described symptoms are improving. She reports improvement with HA.  Current symptoms are mild-no pain & are nonradiating She has been doing HEP with no trouble. She continues to use craniocradle. She has also started doing Yoga. She reports good response to OMT.   01/27/2018: Compared to the last office visit, her previously described symptoms are improving. Current symptoms are mild-no & are nonradiating She reports no change to medications or other modalities for pain.    REVIEW OF SYSTEMS: Denies night time disturbances. Denies fevers, chills, or night sweats. Denies unexplained weight loss. Denies personal history of cancer. Denies changes in bowel or bladder habits. Denies recent unreported falls. Denies new or  worsening dyspnea or wheezing. Reports headaches. Has hx of migraines Denies numbness, tingling or weakness  In the extremities.  Denies dizziness or presyncopal episodes Denies lower extremity edema    HISTORY:  Prior history reviewed and updated per electronic medical record.  Social History   Occupational History  . Not on file  Tobacco Use  . Smoking status: Never Smoker  . Smokeless tobacco: Never Used  Substance and Sexual Activity  . Alcohol use: No  . Drug use: No  . Sexual activity: Yes    Partners: Male    Birth control/protection: Surgical    Comment: Hysterectomy   Social History   Social History Narrative  . Not on file    DATA OBTAINED & REVIEWED:  No results for input(s): HGBA1C, LABURIC, CREATINE in the last 8760 hours. . X-rays cervical spine 02/28/2017: Mild reversal of the normal cervical lordosis but with no significant degenerative changes. Marland Kitchen MRI brain without contrast on 11/02/2013: Subtle foci of high signal that are mild and very subtle and nonspecific, . MRI of the brain with contrast - 11/02/2013 - no abnormal enhancement   OBJECTIVE:  VS:  HT:5\' 5"  (165.1 cm)   WT:126 lb (57.2 kg)  BMI:20.97    BP:110/82  HR:86bpm  TEMP: ( )  RESP:98 %   PHYSICAL EXAM: CONSTITUTIONAL: Well-developed, Well-nourished and In no acute distress Psychiatric: Alert & appropriately interactive. and Not depressed or anxious appearing. RESPIRATORY: No increased work of breathing and Trachea Midline EYES: Pupils are equal., EOM intact without nystagmus. and No scleral icterus.  Upper extremities: EXTREMITY EXAM: Warm and well perfused NEURO: unremarkable Normal associated myotomal distribution strength to manual muscle testing Normal sensation to light touch  MSK Exam: Neck and upper back: . Well aligned, no significant deformity. . No overlying skin changes. . TTP over Right periscapular musculature as well as cervicocranial junction. . Normal, non-painful  Flexion and extension as well as rotation and side bending slightly limited.  Nonpainful except for with terminal motion. .  . Normal, non-painful: Spurling's compression test, Lhermitte's compression test.  No pain with arm squeeze test . Positive/Abnormal: Some pain with cervical extension but no radicular symptoms.   ASSESSMENT   1. Somatic dysfunction of cervical region   2. Neck pain   3. History of migraine   4. Somatic dysfunction of thoracic region   5. Somatic dysfunction of lumbar region   6. Somatic dysfunction of rib cage region   7. Somatic dysfunction of pelvis region   8. Somatic dysfunction of sacral region   9. Somatic dysfunction of head region     PLAN:  Pertinent additional documentation may be included in corresponding procedure notes, imaging studies, problem based documentation and patient instructions.  Procedures:  . Osteopathic manipulation was performed today based on physical exam findings.  Please see procedure note for further information including Osteopathic Exam findings  Medications:  No orders of the defined types were placed in this encounter.  Discussion/Instructions: No problem-specific Assessment & Plan notes found for this encounter.  . She has had a slight recurrence of the wavelike nausea but overall significantly improved from previous. . She continues to show significant improvements in soft tissue tightness and symptoms especially since we have been working on craniosacral aspects. . Discussed red flag symptoms that warrant earlier emergent evaluation and patient voices understanding. . Activity modifications and the importance of avoiding exacerbating activities (limiting pain to no more than a 4 / 10 during or following activity) recommended and discussed.  Follow-up:  . Return in about 2 weeks (around 02/10/2018) for consideration of repeat Osteopathic Manipulation.   . If any lack of improvement consider: consider referral to  Physical Therapy  . At follow up will plan to consider: to consider repeat osteopathic manipulation     CMA/ATC served as scribe during this visit. History, Physical, and Plan performed by medical provider. Documentation and orders reviewed and attested to.      Andrena MewsMichael D Indy Prestwood, DO    [facility] Physician

## 2018-02-01 ENCOUNTER — Encounter: Payer: Self-pay | Admitting: Sports Medicine

## 2018-02-01 NOTE — Progress Notes (Signed)
PROCEDURE NOTE : OSTEOPATHIC MANIPULATION The decision today to treat with Osteopathic Manipulative Therapy (OMT) was based on physical exam findings. Verbal consent was obtained following a discussion with the patient regarding the of risks, benefits and potential side effects, including an acute pain flare,post manipulation soreness and need for repeat treatments. Previously discussed cervical risks.   Contraindications to OMT: NONE   Manipulation was performed as below: Regions Treated OMT Techniques Used  Cervical spine Thoracic spine Ribs Lumbar spine Pelvis Sacrum Cranial HVLA muscle energy myofascial release articulatory counterstrain cranial facilitated positional release   The patient tolerated the treatment well and reported Improved symptoms following treatment today. Patient was given medications, exercises, stretches and lifestyle modifications per AVS and verbally.   OSTEOPATHIC/STRUCTURAL EXAM:   OA - rotated right C3 ERS left (Extended, Rotated & Sidebent) T2 -8 Neutral, Rotated LEFT, Sidebent RIGHT Rib 6 Right  Posterior L4 FRS left (Flexed, Rotated & Sidebent) Left psoas spasm Left anterior innonimate Left unilateral flexion SBS compression

## 2018-02-11 ENCOUNTER — Ambulatory Visit: Payer: Managed Care, Other (non HMO) | Admitting: Sports Medicine

## 2018-02-11 ENCOUNTER — Encounter: Payer: Self-pay | Admitting: Sports Medicine

## 2018-02-11 VITALS — BP 120/76 | HR 85 | Ht 65.0 in | Wt 126.4 lb

## 2018-02-11 DIAGNOSIS — M9903 Segmental and somatic dysfunction of lumbar region: Secondary | ICD-10-CM

## 2018-02-11 DIAGNOSIS — Z8669 Personal history of other diseases of the nervous system and sense organs: Secondary | ICD-10-CM

## 2018-02-11 DIAGNOSIS — M9904 Segmental and somatic dysfunction of sacral region: Secondary | ICD-10-CM

## 2018-02-11 DIAGNOSIS — M542 Cervicalgia: Secondary | ICD-10-CM | POA: Diagnosis not present

## 2018-02-11 DIAGNOSIS — M9905 Segmental and somatic dysfunction of pelvic region: Secondary | ICD-10-CM

## 2018-02-11 DIAGNOSIS — M9901 Segmental and somatic dysfunction of cervical region: Secondary | ICD-10-CM

## 2018-02-11 DIAGNOSIS — M9902 Segmental and somatic dysfunction of thoracic region: Secondary | ICD-10-CM | POA: Diagnosis not present

## 2018-02-11 DIAGNOSIS — M9908 Segmental and somatic dysfunction of rib cage: Secondary | ICD-10-CM

## 2018-02-11 NOTE — Progress Notes (Signed)
PROCEDURE NOTE : OSTEOPATHIC MANIPULATION The decision today to treat with Osteopathic Manipulative Therapy (OMT) was based on physical exam findings. Verbal consent was obtained following a discussion with the patient regarding the of risks, benefits and potential side effects, including an acute pain flare,post manipulation soreness and need for repeat treatments.     Contraindications to OMT: NONE  Manipulation was performed as below: Regions Treated OMT Techniques Used  1. Cervical spine 2. Thoracic spine 3. Ribs 4. Lumbar spine 5. Pelvis 6. Sacrum . HVLA . muscle energy . myofascial release   The patient tolerated the treatment well and reported Improved symptoms following treatment today. Patient was given medications, exercises, stretches and lifestyle modifications per AVS and verbally.   OSTEOPATHIC/STRUCTURAL EXAM:   C2 through C4 FRS left C5 FRS right T2 extended side bent right T4 through T6 rotated left L3 FRS right Posterior ribs 6 right Left on left sacral torsion Right anterior innominate

## 2018-02-15 ENCOUNTER — Encounter: Payer: Self-pay | Admitting: Sports Medicine

## 2018-02-15 NOTE — Procedures (Signed)
PROCEDURE NOTE : OSTEOPATHIC MANIPULATION The decision today to treat with Osteopathic Manipulative Therapy (OMT) was based on physical exam findings. Verbal consent was obtained following a discussion with the patient regarding the of risks, benefits and potential side effects, including an acute pain flare,post manipulation soreness and need for repeat treatments.     Contraindications to OMT: NONE  Manipulation was performed as below: Regions Treated OMT Techniques Used  1. Cervical spine 2. Thoracic spine 3. Ribs . HVLA . muscle energy . myofascial release . soft tissue   The patient tolerated the treatment well and reported Improved symptoms following treatment today. Patient was given medications, exercises, stretches and lifestyle modifications per AVS and verbally.   OSTEOPATHIC/STRUCTURAL EXAM:   OA - rotated right C5 FRrSl T2 -6 Neutral, Rotated LEFT, Sidebent RIGHT Rib 6 Right  Posterior

## 2018-03-07 ENCOUNTER — Ambulatory Visit: Payer: Managed Care, Other (non HMO) | Admitting: Neurology

## 2018-03-07 DIAGNOSIS — G43709 Chronic migraine without aura, not intractable, without status migrainosus: Secondary | ICD-10-CM | POA: Diagnosis not present

## 2018-03-07 DIAGNOSIS — IMO0002 Reserved for concepts with insufficient information to code with codable children: Secondary | ICD-10-CM

## 2018-03-07 MED ORDER — ONABOTULINUMTOXINA 100 UNITS IJ SOLR
155.0000 [IU] | Freq: Once | INTRAMUSCULAR | Status: AC
  Administered 2018-03-07: 155 [IU] via INTRAMUSCULAR

## 2018-03-07 NOTE — Progress Notes (Signed)
[facility]   Procedure Note Botox  Attending: Dr. Jaffe  Preoperative Diagnosis(es): Chronic migraine  Consent obtained from: The patient Benefits discussed included, but were not limited to decreased muscle tightness, increased joint range of motion, and decreased pain.  Risk discussed included, but were not limited pain and discomfort, bleeding, bruising, excessive weakness, venous thrombosis, muscle atrophy and dysphagia.  Anticipated outcomes of the procedure as well as he risks and benefits of the alternatives to the procedure, and the roles and tasks of the personnel to be involved, were discussed with the patient, and the patient consents to the procedure and agrees to proceed. A copy of the patient medication guide was given to the patient which explains the blackbox warning.  Patients identity and treatment sites confirmed Yes.  .  Details of Procedure: Skin was cleaned with alcohol. Prior to injection, the needle plunger was aspirated to make sure the needle was not within a blood vessel.  There was no blood retrieved on aspiration.    Following is a summary of the muscles injected  And the amount of Botulinum toxin used:  Dilution 200 units of Botox was reconstituted with 4 ml of preservative free normal saline. Time of reconstitution: At the time of the office visit (<30 minutes prior to injection)   Injections  155 total units of Botox was injected with a 30 gauge needle.  Injection Sites: L occipitalis: 15 units- 3 sites  R occiptalis: 15 units- 3 sites  L upper trapezius: 15 units- 3 sites R upper trapezius: 15 units- 3 sits          L paraspinal: 10 units- 2 sites R paraspinal: 10 units- 2 sites  Face L frontalis(2 injection sites):10 units   R frontalis(2 injection sites):10 units         L corrugator: 5 units   R corrugator: 5 units           Procerus: 5 units   L temporalis: 20 units R temporalis: 20 units   Agent:  200 units of botulinum Type A  (Onobotulinum Toxin type A) was reconstituted with 4 ml of preservative free normal saline.  Time of reconstitution: At the time of the office visit (<30 minutes prior to injection)     Total injected (Units): 155  Total wasted (Units): 7  Patient tolerated procedure well without complications.   Reinjection is anticipated in 3 months.   

## 2018-03-11 ENCOUNTER — Ambulatory Visit: Payer: Managed Care, Other (non HMO) | Admitting: Sports Medicine

## 2018-03-21 ENCOUNTER — Encounter: Payer: Self-pay | Admitting: Sports Medicine

## 2018-04-03 NOTE — Progress Notes (Signed)
NEUROLOGY FOLLOW UP OFFICE NOTE  Brenda Macias [id number]  HISTORY OF PRESENT ILLNESS: Brenda Macias is a 48 year old woman who follows up for migraines.  UPDATE: Intensity:  moderate Duration:  A day but not severe Frequency:  1 headache last 30 days Frequency of abortive medication: once in last month Current NSAIDS:  diclofenac ER 100mg  daily PRN Current analgesics:  Fioricet (only for severe tension headaches) Current triptans:  Zomig ZMT 5mg , Zembrace SymTouch (has not used) Current anti-emetic:  no Current muscle relaxants:  no Current anti-anxiolytic:  no Current sleep aide:  no Current Antihypertensive medications:  no Current Antidepressant medications:  no Current Anticonvulsant medications:  Trokendi XR 25mg  (50mg  causes drowsiness), gabapentin 100mg  at bedtime Current Vitamins/Herbal/Supplements:  B12 Current Antihistamines/Decongestants:  no Other therapy:  Botox, OMM for neck pain   Caffeine:  1 coke/day, green tea Alcohol:  no Smoker:  no Diet:  Hydrates.  Follows strict diet Exercise:  yes Depression/anxiety:  no Sleep hygiene:  good  HISTORY: Onset:  childhood Location:  Frontal or unilateral either side Quality:  stabbing Initial Intensity:  10/10 Aura:  Flashing blue lights and floaters in vision Prodrome:  Fatigue, hunger, irritability Postdrome:  fatigue Associated symptoms:  Photophobia, phonophobia, osmophobia.  Rarely nausea.  She has not had any new worse headache of her life, waking up from sleep Initial Duration:  1 to 3 days (no more than 2 hours with Zomig except for when she wakes up with migraine, in which they last longer) Initial Frequency: Prior to Botox, almost daily.  Since Botox, 1 migraine day a month and total of 4 or 5 headache days a month. Triggers/exacerbating factors:  Cigarette smoke, perfumes, cheese, citrus, sleep deprivation, too much sleep, menstrual cycle, change in barometric pressure. Relieving factors:  Water,  sleep, rest Activity:  Aggravates She also has bi-temporal non-throbbing tension type headaches.  They have been daily since missing her last Botox in March ([place]  Past NSAIDS:  ibuprofen, naproxen Past analgesics:  acetaminophen, Excedrin Past abortive triptans:  Zecuity patch, Maxalt (ineffective), Relpax (side effects) Past muscle relaxants:  Robaxin Past anti-emetic:  no Past antihypertensive medications:  No.  Propranolol not good option due to hypotension. Past antidepressant medications:  Cannot recall Past anticonvulsant medications:  none Other past therapies:  no  Family history of headache:  Mother, brother  MRI of brain without contrast from 11/02/13 was personally reviewed and demonstrated minimal and subtle foci in the left periventricular and parietal subcortical white matter.  A follow up MRI of brain with contrast was performed on 11/13/13, which revealed no abnormal enhancement.  PAST MEDICAL HISTORY: Past Medical History:  Diagnosis Date  . Endometriosis   . Kidney stones    Cyst and Kidney Stones  . Migraines    pt states onset around the age of 31    MEDICATIONS: Current Outpatient Medications on File Prior to Visit  Medication Sig Dispense Refill  . Cyanocobalamin (NASCOBAL) 500 MCG/0.1ML SOLN Place 0.1 mLs (500 mcg total) into the nose once a week. 4 Bottle 11  . DEXILANT 60 MG capsule TAKE 1 CAPSULE BY MOUTH EVERY DAY 90 capsule 1  . diclofenac (VOLTAREN) 25 MG EC tablet Take 25 mg by mouth daily as needed. Pt unsure of dosage.     . OnabotulinumtoxinA (BOTOX IJ) Botox    . ondansetron (ZOFRAN ODT) 4 MG disintegrating tablet Take 1 tablet (4 mg total) by mouth every 8 (eight) hours as needed for nausea or  vomiting. 20 tablet 0  . ranitidine (ZANTAC) 300 MG tablet TAKE 1 TABLET BY MOUTH EVERYDAY AT BEDTIME 90 tablet 0  . Topiramate ER (TROKENDI XR) 25 MG CP24 Take 25 mg by mouth at bedtime. 30 capsule 3  . VIVELLE-DOT 0.1 MG/24HR  patch APPLY 1 PATCH TWICE WEEKLY  0  . zolmitriptan (ZOMIG-ZMT) 5 MG disintegrating tablet TAKE 1 TABLET ONCE A DAY AS NEEDED (Patient taking differently: as needed) 10 tablet 1   No current facility-administered medications on file prior to visit.     ALLERGIES: Allergies  Allergen Reactions  . Doxycycline Nausea And Vomiting  . Penicillins Rash    rash  . Rocephin [Ceftriaxone] Rash    Rash    FAMILY HISTORY: Family History  Problem Relation Age of Onset  . Endometriosis Mother   . Hypertension Father   . Kidney cancer Father   . Thyroid cancer Father   . Cystic kidney disease Father   . Healthy Brother   . Healthy Brother   . Stomach cancer Neg Hx   . Colon cancer Neg Hx    SOCIAL HISTORY: Social History   Socioeconomic History  . Marital status: Married    Spouse name: Not on file  . Number of children: Not on file  . Years of education: Not on file  . Highest education level: Not on file  Occupational History  . Not on file  Social Needs  . Financial resource strain: Not on file  . Food insecurity:    Worry: Not on file    Inability: Not on file  . Transportation needs:    Medical: Not on file    Non-medical: Not on file  Tobacco Use  . Smoking status: Never Smoker  . Smokeless tobacco: Never Used  Substance and Sexual Activity  . Alcohol use: No  . Drug use: No  . Sexual activity: Yes    Partners: Male    Birth control/protection: Surgical    Comment: Hysterectomy  Lifestyle  . Physical activity:    Days per week: Not on file    Minutes per session: Not on file  . Stress: Not on file  Relationships  . Social connections:    Talks on phone: Not on file    Gets together: Not on file    Attends religious service: Not on file    Active member of club or organization: Not on file    Attends meetings of clubs or organizations: Not on file    Relationship status: Not on file  . Intimate partner violence:    Fear of current or ex partner: Not on  file    Emotionally abused: Not on file    Physically abused: Not on file    Forced sexual activity: Not on file  Other Topics Concern  . Not on file  Social History Narrative  . Not on file    REVIEW OF SYSTEMS: Constitutional: No fevers, chills, or sweats, no generalized fatigue, change in appetite Eyes: No visual changes, double vision, eye pain Ear, nose and throat: No hearing loss, ear pain, nasal congestion, sore throat Cardiovascular: No chest pain, palpitations Respiratory:  No shortness of breath at rest or with exertion, wheezes GastrointestinaI: No nausea, vomiting, diarrhea, abdominal pain, fecal incontinence Genitourinary:  No dysuria, urinary retention or frequency Musculoskeletal:  No neck pain, back pain Integumentary: No rash, pruritus, skin lesions Neurological: as above Psychiatric: No depression, insomnia, anxiety Endocrine: No palpitations, fatigue, diaphoresis, mood swings, change in appetite,  change in weight, increased thirst Hematologic/Lymphatic:  No purpura, petechiae. Allergic/Immunologic: no itchy/runny eyes, nasal congestion, recent allergic reactions, rashes  PHYSICAL EXAM: Blood pressure 102/74, pulse 97, height 5\' 5"  (1.651 m), weight 126 lb (57.2 kg), SpO2 99 %. General: No acute distress.  Patient appears well-groomed. . Head:  Normocephalic/atraumatic Eyes:  Fundi examined but not visualized Neck: supple, no paraspinal tenderness, full range of motion Heart:  Regular rate and rhythm Lungs:  Clear to auscultation bilaterally Back: No paraspinal tenderness Neurological Exam: alert and oriented to person, place, and time. Attention span and concentration intact, recent and remote memory intact, fund of knowledge intact.  Speech fluent and not dysarthric, language intact.  CN II-XII intact. Bulk and tone normal, muscle strength 5/5 throughout.  Sensation to light touch  intact.  Deep tendon reflexes 2+ throughout.  Finger to nose testing intact.   Gait normal, Romberg negative.  IMPRESSION: Migraine without aura, without status migrainosus, not intractable  PLAN: 1.  For preventative management, topiramate and Botox 2.  For abortive therapy, zomig 5mg  tablet, Zembrace if needed. 3.  Limit use of pain relievers to no more than 2 days out of week to prevent risk of rebound or medication-overuse headache. 4.  Keep headache diary 5.  Exercise, hydration, caffeine cessation, sleep hygiene, monitor for and avoid triggers 6.  Consider:  magnesium citrate 400mg  daily, riboflavin 400mg  daily, and coenzyme Q10 100mg  three times daily 7.  Follow up for next Botox  Shon MilletAdam Analisse Randle, DO  CC: Waldon MerlWilliam C Martin, PA-C

## 2018-04-06 ENCOUNTER — Encounter: Payer: Self-pay | Admitting: Physician Assistant

## 2018-04-06 DIAGNOSIS — E538 Deficiency of other specified B group vitamins: Secondary | ICD-10-CM

## 2018-04-07 ENCOUNTER — Encounter: Payer: Self-pay | Admitting: Neurology

## 2018-04-07 ENCOUNTER — Ambulatory Visit: Payer: Managed Care, Other (non HMO) | Admitting: Neurology

## 2018-04-07 VITALS — BP 102/74 | HR 97 | Ht 65.0 in | Wt 126.0 lb

## 2018-04-07 DIAGNOSIS — G43009 Migraine without aura, not intractable, without status migrainosus: Secondary | ICD-10-CM | POA: Diagnosis not present

## 2018-04-10 ENCOUNTER — Other Ambulatory Visit (INDEPENDENT_AMBULATORY_CARE_PROVIDER_SITE_OTHER): Payer: Managed Care, Other (non HMO)

## 2018-04-10 ENCOUNTER — Encounter: Payer: Self-pay | Admitting: Sports Medicine

## 2018-04-10 DIAGNOSIS — E538 Deficiency of other specified B group vitamins: Secondary | ICD-10-CM

## 2018-04-10 LAB — VITAMIN B12: Vitamin B-12: 949 pg/mL — ABNORMAL HIGH (ref 211–911)

## 2018-04-10 NOTE — Progress Notes (Signed)
Veverly FellsMichael D. Delorise Shinerigby, DO  [facility] [phone]  Leamon Arntimothy Dunphy - 48 y.o. female MRN [id number]  Date of birth: 03/27/1970  Visit Date: 02/11/2018  PCP: Waldon MerlMartin, William C, PA-C   Referred by: Waldon MerlMartin, William C, PA-C  SUBJECTIVE:   Chief Complaint  Patient presents with  . Follow-up    Neck pain    HPI: Patient is here today for follow-up of her neck and back pain.  She is continued having right-sided temporal headaches.  It has been improved with osteopathic manipulation.  She is performing her home therapeutic exercises using a cranial cradle, performing yoga and the most effective treatment has been osteopathic manipulation.  She is requesting this be repeated today.  No other changes in her overall health.  REVIEW OF SYSTEMS: No significant nighttime awakenings due to this issue. Denies fevers, chills, recent weight gain or weight loss.  No night sweats.  Pt denies any change in bowel or bladder habits, muscle weakness, numbness or falls associated with this pain.  HISTORY:  Prior history reviewed and updated per electronic medical record.  Patient Active Problem List   Diagnosis Date Noted  . Abdominal pain, epigastric 05/29/2017  . Gastroesophageal reflux disease 05/29/2017  . Early satiety 05/29/2017  . Gastritis 05/15/2017  . Neck pain 03/07/2017  . Visit for preventive health examination 10/22/2016  . History of abnormal cervical Papanicolaou smear 08/13/2016  . Menopause present 06/27/2016  . History of migraine 06/26/2016   Social History   Occupational History  . Not on file  Tobacco Use  . Smoking status: Never Smoker  . Smokeless tobacco: Never Used  Substance and Sexual Activity  . Alcohol use: No  . Drug use: No  . Sexual activity: Yes    Partners: Male    Birth control/protection: Surgical    Comment: Hysterectomy   Social History   Social History Narrative  . Not on file    OBJECTIVE:    VS:  HT:5\' 5"  (165.1 cm)   WT:126 lb 6.4 oz (57.3 kg)  BMI:21.03    BP:120/76  HR:85bpm  TEMP: ( )  RESP:97 %   PHYSICAL EXAM: CONSTITUTIONAL: Well-developed, Well-nourished and In no acute distress EYES: Pupils are equal., EOM intact without nystagmus. and No scleral icterus. Psychiatric: Alert & appropriately interactive. and Not depressed or anxious appearing. EXTREMITY EXAM: Warm and well perfused  Good cervical and lumbar range of motion although there are functional limitations. Negative Spurling's compression test and Lhermitte's compression test.   Upper extremity lower extremity strength is 5/5 in all myotomes. Normal sensation Significant anterior chain dominant posture.    ASSESSMENT:   1. Neck pain   2. History of migraine   3. Somatic dysfunction of cervical region   4. Somatic dysfunction of thoracic region   5. Somatic dysfunction of lumbar region   6. Somatic dysfunction of rib cage region   7. Somatic dysfunction of pelvis region   8. Somatic dysfunction of sacral region     PROCEDURES:  Osteopathic manipulation was performed today based on physical exam findings.  Please see procedure note for further information including Osteopathic Exam findings      PLAN:  Pertinent additional documentation may be included in corresponding procedure notes, imaging studies, problem based documentation and patient instructions.  No problem-specific Assessment & Plan notes found for this encounter.   Continue previously prescribed home exercise program.   Osteopathic manipulation was performed today based on physical exam findings.  Patient has responded well to osteopathic manipulation previously the prior manipulation did not provide permanent long lasting relief.  The patient does feel as though there was significant benefit to the prior manipulation and they wished for repeat manipulation today.  They understand that home therapeutic exercises are critical part of  the healing/treatment process and will continue with self treatment between now and their next visit as outlined.  The patient understands that the frequency of visits is meant to provide a stimulus to promote the body's own ability to heal and is not meant to be the sole means for improvement in their symptoms.  Activity modifications and the importance of avoiding exacerbating activities (limiting pain to no more than a 4 / 10 during or following activity) recommended and discussed.  Discussed red flag symptoms that warrant earlier emergent evaluation and patient voices understanding.  Return in about 4 weeks (around 03/11/2018) for consideration of repeat Osteopathic Manipulation.          [name], DO    [facility]

## 2018-04-11 ENCOUNTER — Other Ambulatory Visit: Payer: Self-pay | Admitting: Physician Assistant

## 2018-04-11 DIAGNOSIS — E538 Deficiency of other specified B group vitamins: Secondary | ICD-10-CM

## 2018-04-11 MED ORDER — CYANOCOBALAMIN 1000 MCG/ML IJ SOLN: 1000.0000 ug | INTRAMUSCULAR | 3 refills | Status: DC

## 2018-04-17 ENCOUNTER — Ambulatory Visit: Payer: Managed Care, Other (non HMO) | Admitting: Sports Medicine

## 2018-04-17 ENCOUNTER — Encounter: Payer: Self-pay | Admitting: Sports Medicine

## 2018-04-17 VITALS — BP 102/76 | HR 81 | Ht 65.0 in | Wt 126.4 lb

## 2018-04-17 DIAGNOSIS — M9900 Segmental and somatic dysfunction of head region: Secondary | ICD-10-CM

## 2018-04-17 DIAGNOSIS — M9902 Segmental and somatic dysfunction of thoracic region: Secondary | ICD-10-CM

## 2018-04-17 DIAGNOSIS — M9901 Segmental and somatic dysfunction of cervical region: Secondary | ICD-10-CM

## 2018-04-17 DIAGNOSIS — Z8669 Personal history of other diseases of the nervous system and sense organs: Secondary | ICD-10-CM

## 2018-04-17 DIAGNOSIS — M9908 Segmental and somatic dysfunction of rib cage: Secondary | ICD-10-CM

## 2018-04-17 DIAGNOSIS — M542 Cervicalgia: Secondary | ICD-10-CM | POA: Diagnosis not present

## 2018-04-17 NOTE — Progress Notes (Signed)
Brenda Macias. [facility] at [facility] [phone]  [place] - 48 y.o. female MRN [id number]  Date of birth: 1970-05-27  Visit Date: April 18, 2018  PCP: Waldon Merl, PA-C   Referred by: Waldon Merl, PA-C  SUBJECTIVE:  Chief Complaint  Patient presents with  . Neck - Follow-up    XR C-spine 02/28/2017. Using craniocradle. Provided with HEP. Ice and IBU prn. Waking up with HA more frequently.     HPI: Patient is reporting overall good improvements in her headaches in general however over the past week she has had stiffness and slight headache with daily awakening.  It is right temporal in nature.  She is responded well to ice ibuprofen and osteopathic manipulation.  She is using a cranial cradle and doing well with this.  REVIEW OF SYSTEMS: No significant nighttime awakenings due to this issue. Denies fevers, chills, recent weight gain or weight loss.  No night sweats.  Pt denies any change in bowel or bladder habits, muscle weakness, numbness or falls associated with this pain.  HISTORY:  Prior history reviewed and updated per electronic medical record.  Patient Active Problem List   Diagnosis Date Noted  . Abdominal pain, epigastric 05/29/2017  . Gastroesophageal reflux disease 05/29/2017  . Early satiety 05/29/2017  . Gastritis 05/15/2017  . Neck pain 03/07/2017  . Visit for preventive health examination 10/22/2016  . History of abnormal cervical Papanicolaou smear 08/13/2016  . Menopause present 06/27/2016  . History of migraine 06/26/2016   Social History   Occupational History  . Not on file  Tobacco Use  . Smoking status: Never Smoker  . Smokeless tobacco: Never Used  Substance and Sexual Activity  . Alcohol use: No  . Drug use: No  . Sexual activity: Yes    Partners: Male    Birth control/protection: Surgical    Comment: Hysterectomy   Social History   Social History Narrative  . Not  on file    OBJECTIVE:  VS:  HT:5\' 5"  (165.1 cm)   WT:126 lb 6.4 oz (57.3 kg)  BMI:21.03    BP:102/76  HR:81bpm  TEMP: ( )  RESP:99 %   PHYSICAL EXAM: Adult female. No acute distress.  Alert and appropriate. Neck is held slightly rigid but overall good range of motion.  She has negative Spurling's compression test Lhermitte's compression test.  Normal upper extremity strength.  Her range of motion is functionally limited.   ASSESSMENT:   1. Neck pain   2. History of migraine   3. Somatic dysfunction of cervical region   4. Somatic dysfunction of thoracic region   5. Somatic dysfunction of rib cage region   6. Somatic dysfunction of head region     PROCEDURES:  PROCEDURE NOTE: OSTEOPATHIC MANIPULATION  The decision today to treat with Osteopathic Manipulative Therapy (OMT) was based on physical exam findings. Verbal consent was obtained following a discussion with the patient regarding the of risks, benefits and potential side effects, including an acute pain flare,post manipulation soreness and need for repeat treatments.   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 Patient was given medications, exercises, stretches and lifestyle modifications per AVS and verbally.   PLAN:  Pertinent additional documentation may be included in corresponding procedure notes, imaging studies, problem based  documentation and patient instructions.   Patient did have improvements in her mobilization of her cervical and thoracic spine however did have slight exacerbation in her wavelike nausea that she occasionally will get.  This is likely representing some underlying vestibular dysfunction.  We also discussed stress and past trauma and how that can manifest in physical ways especially around high cervicogenic headaches.  Given the increased symptoms conservative measures overnight recommended and will follow-up tomorrow.  No orders of the defined types were placed in this encounter.  Lab Orders  No laboratory test(s) ordered today   Imaging Orders  No imaging studies ordered today   Referral Orders  No referral(s) requested today    At follow up will plan to consider: repeat osteopathic manipulation  Return in about 1 day (around 04/18/2018).          Andrena MewsMichael D Rigby, DO    [facility]ician

## 2018-04-18 ENCOUNTER — Ambulatory Visit: Payer: Managed Care, Other (non HMO) | Admitting: Sports Medicine

## 2018-04-18 ENCOUNTER — Encounter: Payer: Self-pay | Admitting: Sports Medicine

## 2018-04-18 VITALS — BP 100/70 | HR 89 | Ht 65.0 in

## 2018-04-18 DIAGNOSIS — M9904 Segmental and somatic dysfunction of sacral region: Secondary | ICD-10-CM

## 2018-04-18 DIAGNOSIS — Z8669 Personal history of other diseases of the nervous system and sense organs: Secondary | ICD-10-CM | POA: Diagnosis not present

## 2018-04-18 DIAGNOSIS — M542 Cervicalgia: Secondary | ICD-10-CM

## 2018-04-18 DIAGNOSIS — M9901 Segmental and somatic dysfunction of cervical region: Secondary | ICD-10-CM | POA: Diagnosis not present

## 2018-04-18 DIAGNOSIS — M9900 Segmental and somatic dysfunction of head region: Secondary | ICD-10-CM

## 2018-04-18 DIAGNOSIS — M9902 Segmental and somatic dysfunction of thoracic region: Secondary | ICD-10-CM

## 2018-04-20 NOTE — Progress Notes (Signed)
Brenda Macias. [name] Sports Medicine [facility] at [facility] [phone]  Chyler Guerrant - 48 y.o. female MRN [id number]  Date of birth: 27-Mar-1970  Visit Date: 04/18/2018  PCP: Waldon Merl, PA-C   Referred by: Waldon Merl, PA-C  SUBJECTIVE:   Chief Complaint  Patient presents with  . Follow-up    neck pain    HPI: Patient is here after adjustment yesterday with slight exacerbation of her pain.  She is continuing to have soreness on the right side of her neck and describes a generalized whole head headache today that is slightly different.  Waves of nausea are mild at this time.  She did have a difficult time getting a full night sleep and has been using ice and her prenatal cradle.  She denies any radicular symptoms.  No nausea or vomiting.  REVIEW OF SYSTEMS: Per HPI  HISTORY:  Prior history reviewed and updated per electronic medical record.  Patient Active Problem List   Diagnosis Date Noted  . Abdominal pain, epigastric 05/29/2017  . Gastroesophageal reflux disease 05/29/2017  . Early satiety 05/29/2017  . Gastritis 05/15/2017  . Neck pain 03/07/2017  . Visit for preventive health examination 10/22/2016  . History of abnormal cervical Papanicolaou smear 08/13/2016  . Menopause present 06/27/2016  . History of migraine 06/26/2016   Social History   Occupational History  . Not on file  Tobacco Use  . Smoking status: Never Smoker  . Smokeless tobacco: Never Used  Substance and Sexual Activity  . Alcohol use: No  . Drug use: No  . Sexual activity: Yes    Partners: Male    Birth control/protection: Surgical    Comment: Hysterectomy   Social History   Social History Narrative  . Not on file    OBJECTIVE:  VS:  HT:5\' 5"  (165.1 cm)   WT:(Did not take today)  BMI:     BP:100/70  HR:89bpm  TEMP: ( )  RESP:99 %   PHYSICAL EXAM: Adult female. No acute distress.  Alert and appropriate. No significant neurologic  deficits.  Bilateral upper extremity strength is normal.  Full cervical range of motion.  She does have greater occipital triangle tenderness to palpation today that does not cause any radicular symptoms.  Full overhead range of motion of her shoulders.   ASSESSMENT:   1. Neck pain   2. History of migraine   3. Somatic dysfunction of cervical region   4. Somatic dysfunction of thoracic region   5. Somatic dysfunction of sacral region     PROCEDURES:  PROCEDURE NOTE: OSTEOPATHIC MANIPULATION  The decision today to treat with Osteopathic Manipulative Therapy (OMT) was based on physical exam findings. Verbal consent was obtained following a discussion with the patient regarding the of risks, benefits and potential side effects, including an acute pain flare,post manipulation soreness and need for repeat treatments.   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 Patient was given medications, exercises, stretches and lifestyle modifications per AVS and verbally.      PLAN:  Pertinent additional documentation may be included in corresponding procedure notes, imaging studies, problem based documentation and patient instructions.  No problem-specific Assessment & Plan notes found for this encounter.   We did have a long discussion today there is some associated physical manifestation of underlying emotional stress is likely exacerbating some of these issues.  She does have a slight increased  emotional response with certain areas of manipulation especially within the suboccipital region.  She does agree that this is likely a component and does have the awareness that this is associated.  We will have her continue working on her home therapeutic exercises and plan to check in with her in 2 weeks for consideration of repeat OMT at that time.   Osteopathic manipulation was performed today based on physical exam findings.  Patient has responded well to osteopathic manipulation previously the prior manipulation did not provide permanent long lasting relief.  The patient does feel as though there was significant benefit to the prior manipulation and they wished for repeat manipulation today.  They understand that home therapeutic exercises are critical part of the healing/treatment process and will continue with self treatment between now and their next visit as outlined.  The patient understands that the frequency of visits is meant to provide a stimulus to promote the body's own ability to heal and is not meant to be the sole means for improvement in their symptoms.  Activity modifications and the importance of avoiding exacerbating activities (limiting pain to no more than a 4 / 10 during or following activity) recommended and discussed.  Discussed red flag symptoms that warrant earlier emergent evaluation and patient voices understanding.   No orders of the defined types were placed in this encounter.  Lab Orders  No laboratory test(s) ordered today   Imaging Orders  No imaging studies ordered today   Referral Orders  No referral(s) requested today    Return in about 2 weeks (around 05/02/2018) for consideration of repeat Osteopathic Manipulation.          [name], DO    [place] [facility]

## 2018-04-28 ENCOUNTER — Encounter: Payer: Self-pay | Admitting: Physician Assistant

## 2018-04-29 MED ORDER — SCOPOLAMINE 1 MG/3DAYS TD PT72: 1.0000 | MEDICATED_PATCH | TRANSDERMAL | 12 refills | Status: DC

## 2018-05-02 ENCOUNTER — Ambulatory Visit: Payer: Managed Care, Other (non HMO) | Admitting: Sports Medicine

## 2018-05-02 ENCOUNTER — Encounter: Payer: Self-pay | Admitting: Sports Medicine

## 2018-05-02 VITALS — BP 100/72 | HR 84 | Ht 65.0 in | Wt 126.2 lb

## 2018-05-02 DIAGNOSIS — M9901 Segmental and somatic dysfunction of cervical region: Secondary | ICD-10-CM | POA: Diagnosis not present

## 2018-05-02 DIAGNOSIS — M9902 Segmental and somatic dysfunction of thoracic region: Secondary | ICD-10-CM | POA: Diagnosis not present

## 2018-05-02 DIAGNOSIS — Z8669 Personal history of other diseases of the nervous system and sense organs: Secondary | ICD-10-CM | POA: Diagnosis not present

## 2018-05-02 DIAGNOSIS — M9908 Segmental and somatic dysfunction of rib cage: Secondary | ICD-10-CM

## 2018-05-02 DIAGNOSIS — M542 Cervicalgia: Secondary | ICD-10-CM | POA: Diagnosis not present

## 2018-05-04 ENCOUNTER — Encounter: Payer: Self-pay | Admitting: Sports Medicine

## 2018-05-04 NOTE — Progress Notes (Signed)
Brenda Macias. Brenda Macias Sports Medicine [facility] at [facility] [phone]  Brenda Macias - 48 y.o. female MRN [id number]  Date of birth: 01-23-1971  Visit Date: 05/02/2018  PCP: Brenda Merl, PA-C   Referred by: Brenda Merl, PA-C  SUBJECTIVE:   Chief Complaint  Patient presents with  . Follow-up    Neck pain.  OMT.  Has HEP and uses craniocradle.    HPI: Patient with markedly improved symptoms.  Overall her headaches have decreased.  She is having only mild tightness in the right side of her neck.  She is no longer having wavelike nausea.  She is no longer waking up with worsening headaches.  She is continue to perform her home therapeutic exercises as well as using a cranial cradle.  She does feel better than she has in quite some time.  REVIEW OF SYSTEMS: No significant nighttime awakenings due to this issue. Denies fevers, chills, recent weight gain or weight loss.  No night sweats.   HISTORY:  Prior history reviewed and updated per electronic medical record.  Patient Active Problem List   Diagnosis Date Noted  . Abdominal pain, epigastric 05/29/2017  . Gastroesophageal reflux disease 05/29/2017  . Early satiety 05/29/2017  . Gastritis 05/15/2017  . Neck pain 03/07/2017  . Visit for preventive health examination 10/22/2016  . History of abnormal cervical Papanicolaou smear 08/13/2016  . Menopause present 06/27/2016  . History of migraine 06/26/2016   Social History   Occupational History  . Not on file  Tobacco Use  . Smoking status: Never Smoker  . Smokeless tobacco: Never Used  Substance and Sexual Activity  . Alcohol use: No  . Drug use: No  . Sexual activity: Yes    Partners: Male    Birth control/protection: Surgical    Comment: Hysterectomy   Social History   Social History Narrative  . Not on file    OBJECTIVE:  VS:  HT:5\' 5"  (165.1 cm)   WT:126 lb 3.2 oz (57.2 kg)  BMI:21    BP:100/72  HR:84bpm  TEMP: (  )  RESP:97 %   PHYSICAL EXAM: Adult female. No acute distress.  Alert and appropriate. Good cervical and lumbar range of motion although there are functional limitations. Negative Spurling's compression test and Lhermitte's compression test.   Upper extremity lower extremity strength is 5/5 in all myotomes. Normal sensation Significant anterior chain dominant posture.    ASSESSMENT:   1. Neck pain   2. History of migraine   3. Somatic dysfunction of cervical region   4. Somatic dysfunction of thoracic region   5. Somatic dysfunction of rib cage region     PROCEDURES:  PROCEDURE NOTE : OSTEOPATHIC MANIPULATION The decision today to treat with Osteopathic Manipulative Therapy (OMT) was based on physical exam findings. Verbal consent was obtained following a discussion with the patient regarding the of risks, benefits and potential side effects, including an acute pain flare,post manipulation soreness and need for repeat treatments. Minimal risk of injury to neurovascular structures with cervical manipulation previously discussed in detailed and reaffirmed today.   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 Patient was given medications, exercises, stretches and lifestyle modifications per AVS and verbally.     PLAN:  Pertinent additional documentation may be included in corresponding procedure notes, imaging studies, problem based documentation and patient instructions.  No problem-specific Assessment & Plan notes found for this  encounter.   Overall this is markedly better.  Repeat manipulation performed today.  Encouraged her to continue with her home therapeutic exercises.  She will plan to follow-up if any recurrent symptoms but overall given the good control she is obtained she will continue with her home therapeutic modalities and follow-up only as needed.   Continue previously prescribed home exercise program.   Osteopathic manipulation was performed today based on physical exam findings.  Patient has responded well to osteopathic manipulation previously the prior manipulation did not provide permanent long lasting relief.  The patient does feel as though there was significant benefit to the prior manipulation and they wished for repeat manipulation today.  They understand that home therapeutic exercises are critical part of the healing/treatment process and will continue with self treatment between now and their next visit as outlined.  The patient understands that the frequency of visits is meant to provide a stimulus to promote the body's own ability to heal and is not meant to be the sole means for improvement in their symptoms.  Activity modifications and the importance of avoiding exacerbating activities (limiting pain to no more than a 4 / 10 during or following activity) recommended and discussed.  Discussed red flag symptoms that warrant earlier emergent evaluation and patient voices understanding.   No orders of the defined types were placed in this encounter.  Lab Orders  No laboratory test(s) ordered today   Imaging Orders  No imaging studies ordered today   Referral Orders  No referral(s) requested today    At follow up will plan to consider: repeat osteopathic manipulation  Return if symptoms worsen or fail to improve.          [name], DO    [place] [facility] Physician

## 2018-06-06 ENCOUNTER — Ambulatory Visit: Payer: Managed Care, Other (non HMO) | Admitting: Neurology

## 2018-06-14 ENCOUNTER — Other Ambulatory Visit: Payer: Self-pay | Admitting: Gastroenterology

## 2018-06-14 DIAGNOSIS — K2950 Unspecified chronic gastritis without bleeding: Secondary | ICD-10-CM

## 2018-06-17 ENCOUNTER — Other Ambulatory Visit: Payer: Self-pay | Admitting: Neurology

## 2018-06-17 MED ORDER — DICLOFENAC SODIUM ER 100 MG PO TB24: 100.0000 mg | ORAL_TABLET | Freq: Every day | ORAL | 0 refills | Status: DC | PRN

## 2018-06-17 NOTE — Progress Notes (Signed)
Refilled

## 2018-06-22 ENCOUNTER — Other Ambulatory Visit: Payer: Self-pay | Admitting: Neurology

## 2018-07-07 ENCOUNTER — Telehealth: Payer: Self-pay

## 2018-07-07 NOTE — Telephone Encounter (Signed)
Called and spoke with Pt. I advised her I will have Botox delivered before the PA expires 07/09/18. We discussed in the event we are able to see Pt's in the office soon, I will make sure she is scheduled ASAP. I will also send a message to Dr Everlena Cooper to see if there is anything additionally we can offer until she is able to have Botox.  Called [facility] [phone] to coordinate delivery, spoke with Theodoro Grist ([facility]) He called Pt, confirmed ok to ship. He then stated need PA. I advised him of the following: J0585 Botox apprvd 200u injected Q 60mo for 1 yr  07/08/17-07/09/18  PA# [id number]   [id number] PA#[id number] 07/08/17-07/09/18 He brought Demetra on the line, who stated they had switched systems and it now requires another PA. I asked to speak with a manger. I spoke with Junious Dresser. She moved the existing PA to the new system, she had to give me a new PA for this shipment [id number] 05/03/18 - 07/09/18 for both [id number] and [id number]. Theodoro Grist stayed on the line for the entire call, almost 2 hrs, unfortunately was disconnected. Called back spoke with Darius, who advised me he was ablt to see my original PA, plus another back to back with it, with the same PA# good until 10/09/18. Darius was unable to release for shipment, he had to forward to another department that will contact me tomorrow. I can reach them at [phone]

## 2018-07-08 ENCOUNTER — Other Ambulatory Visit: Payer: Self-pay

## 2018-07-08 MED ORDER — TOPIRAMATE ER 50 MG PO CAP24: 50.0000 mg | ORAL_CAPSULE | Freq: Every day | ORAL | 1 refills | Status: DC

## 2018-07-11 ENCOUNTER — Other Ambulatory Visit: Payer: Self-pay

## 2018-07-15 ENCOUNTER — Encounter: Payer: Self-pay | Admitting: *Deleted

## 2018-07-15 NOTE — Progress Notes (Signed)
Received authorization [id number] [id number] included approval effective 07/15/2018-07/15/2019 [facility] must be used

## 2018-07-15 NOTE — Progress Notes (Signed)
[facility] faxed request for renewal of Botox PA 07/15/2018

## 2018-07-25 ENCOUNTER — Telehealth: Payer: Self-pay

## 2018-07-25 NOTE — Telephone Encounter (Signed)
Caller left message with after hour service on 07-25-18 @ 12:43   Caller states that she has a message for [place]. Caller needs her to call Rosann Auerbach about patient who needs a prior auth for medication   Botox is rejecting

## 2018-07-25 NOTE — Telephone Encounter (Signed)
Called [facility] [phone] to coordinate office delivery, spoke with Bri. She placed me on hold after informing me Botox will still require auth, call was disconnected.  Called back, spoke with Marylene Land, was placed on hold again while she attempted to reach [place], call was again disconnected.

## 2018-07-25 NOTE — Telephone Encounter (Signed)
Called the number left by Fleet Contras [phone]   and spoke with Joy. She was insistent that I be transferred to the pharmacy. I asked to speak with someone else in her department, she transferred me, but no one came to the phone after 14 minutes, I disconnected the call

## 2018-07-29 NOTE — Telephone Encounter (Signed)
Called [facility] again, spoke with Irish Lack R to try and sort out the confusion and coordinate office delivery of Botox. Provided her with the auth number from the [place] we received on 07/15/18. She reached out to [facility] and cleared up the misunderstanding.  Botox to be received tomorrow 07/30/18

## 2018-08-01 ENCOUNTER — Ambulatory Visit: Payer: Managed Care, Other (non HMO) | Admitting: Neurology

## 2018-08-01 ENCOUNTER — Other Ambulatory Visit: Payer: Self-pay

## 2018-08-01 ENCOUNTER — Other Ambulatory Visit: Payer: Self-pay | Admitting: Neurology

## 2018-08-01 DIAGNOSIS — G43709 Chronic migraine without aura, not intractable, without status migrainosus: Secondary | ICD-10-CM

## 2018-08-01 DIAGNOSIS — IMO0002 Reserved for concepts with insufficient information to code with codable children: Secondary | ICD-10-CM

## 2018-08-01 MED ORDER — ONABOTULINUMTOXINA 100 UNITS IJ SOLR
200.0000 [IU] | Freq: Once | INTRAMUSCULAR | Status: AC
  Administered 2018-08-01: 16:00:00 200 [IU] via INTRAMUSCULAR

## 2018-08-01 MED ORDER — SUMATRIPTAN SUCCINATE 3 MG/0.5ML ~~LOC~~ SOAJ: 3.0000 mg | SUBCUTANEOUS | 5 refills | Status: DC | PRN

## 2018-08-01 MED ORDER — TOPIRAMATE ER 25 MG PO CAP24: 25.0000 mg | ORAL_CAPSULE | Freq: Every day | ORAL | 5 refills | Status: DC

## 2018-08-01 NOTE — Progress Notes (Signed)
Patient will reduce dose of Trokendi XR back to 25mg  at bedtime due to cognitive changes.  Sent prescription to her pharmacy.  Patient did well with Zembrace.  Sent prescription to her pharmacy.

## 2018-08-01 NOTE — Progress Notes (Signed)
[facility]   Procedure Note Botox  Attending: Dr.    Preoperative Diagnosis(es): Chronic migraine  Consent obtained from: The patient Benefits discussed included, but were not limited to decreased muscle tightness, increased joint range of motion, and decreased pain.  Risk discussed included, but were not limited pain and discomfort, bleeding, bruising, excessive weakness, venous thrombosis, muscle atrophy and dysphagia.  Anticipated outcomes of the procedure as well as he risks and benefits of the alternatives to the procedure, and the roles and tasks of the personnel to be involved, were discussed with the patient, and the patient consents to the procedure and agrees to proceed. A copy of the patient medication guide was given to the patient which explains the blackbox warning.  Patients identity and treatment sites confirmed Yes.  .  Details of Procedure: Skin was cleaned with alcohol. Prior to injection, the needle plunger was aspirated to make sure the needle was not within a blood vessel.  There was no blood retrieved on aspiration.    Following is a summary of the muscles injected  And the amount of Botulinum toxin used:  Dilution 200 units of Botox was reconstituted with 4 ml of preservative free normal saline. Time of reconstitution: At the time of the office visit (<30 minutes prior to injection)   Injections  155 total units of Botox was injected with a 30 gauge needle.  Injection Sites: L occipitalis: 15 units- 3 sites  R occiptalis: 15 units- 3 sites  L upper trapezius: 15 units- 3 sites R upper trapezius: 15 units- 3 sits          L paraspinal: 10 units- 2 sites R paraspinal: 10 units- 2 sites  Face L frontalis(2 injection sites):10 units   R frontalis(2 injection sites):10 units         L corrugator: 5 units   R corrugator: 5 units           Procerus: 5 units   L temporalis: 20 units R temporalis: 20 units   Agent:  200 units of botulinum Type  A (Onobotulinum Toxin type A) was reconstituted with 4 ml of preservative free normal saline.  Time of reconstitution: At the time of the office visit (<30 minutes prior to injection)     Total injected (Units): 155  Total wasted (Units): None wasted.  Patient tolerated procedure well without complications.   Reinjection is anticipated in 3 months. Return to clinic in 4 1/2 months   

## 2018-08-03 ENCOUNTER — Other Ambulatory Visit: Payer: Self-pay | Admitting: Neurology

## 2018-08-04 ENCOUNTER — Telehealth: Payer: Self-pay

## 2018-08-04 NOTE — Telephone Encounter (Signed)
Rcvd refill request for Trokendi 50 mg, Pt has reduced dose to 25 mg due to cognitive issues. Called and spoke with Pt to confirm she is taking the 25 mg, she confirmed that dose. I advised her I will deny the request for 50 mg.

## 2018-08-13 DIAGNOSIS — Z809 Family history of malignant neoplasm, unspecified: Secondary | ICD-10-CM | POA: Insufficient documentation

## 2018-08-13 DIAGNOSIS — R6882 Decreased libido: Secondary | ICD-10-CM | POA: Insufficient documentation

## 2018-08-29 ENCOUNTER — Encounter: Payer: Self-pay | Admitting: Physician Assistant

## 2018-08-29 ENCOUNTER — Telehealth: Payer: Self-pay | Admitting: *Deleted

## 2018-08-29 ENCOUNTER — Other Ambulatory Visit: Payer: Self-pay

## 2018-08-29 ENCOUNTER — Ambulatory Visit
Admission: RE | Admit: 2018-08-29 | Discharge: 2018-08-29 | Disposition: A | Discharge status: Home / Self Care | Payer: Managed Care, Other (non HMO) | Source: Ambulatory Visit | Attending: Physician Assistant | Admitting: Physician Assistant

## 2018-08-29 ENCOUNTER — Ambulatory Visit (INDEPENDENT_AMBULATORY_CARE_PROVIDER_SITE_OTHER): Payer: Managed Care, Other (non HMO) | Admitting: Physician Assistant

## 2018-08-29 DIAGNOSIS — Z87442 Personal history of urinary calculi: Secondary | ICD-10-CM

## 2018-08-29 DIAGNOSIS — R109 Unspecified abdominal pain: Secondary | ICD-10-CM

## 2018-08-29 MED ORDER — HYDROCODONE-ACETAMINOPHEN 10-325 MG PO TABS: 1.0000 | ORAL_TABLET | Freq: Three times a day (TID) | ORAL | 0 refills | Status: AC | PRN

## 2018-08-29 NOTE — Progress Notes (Signed)
Virtual Visit via Video   I connected with patient on 08/29/18 at  9:40 AM EDT by a video enabled telemedicine application and verified that I am speaking with the correct person using two identifiers.  Location patient: Home Location provider: [facility], Office Persons participating in the virtual visit: Patient, Provider, PA-Student Anibal Henderson), CMA (Eduard Clos)  I discussed the limitations of evaluation and management by telemedicine and the availability of in person appointments. The patient expressed understanding and agreed to proceed.  Subjective:   HPI:   Patient presents via Doxy.Me today c/o pain in R flank starting yesterday morning, first noted on waking. Notes pain progressed to be a 6-7/10 thoughout day, although occurring in spells. Today pain levels is better overall but the pain is constant at 2/10 with episodic flares to 6/7. Notes cannot get comfortable despite positional change. Notes some pain worse with walking she believes. Pain today progressing, between hip and ribs in right area. Notes nausea comes and going, no vomiting, fever, headaches, abdominal pain/ cramping, D,C. Notes increased frequency. Denies dysuria, hematuria, urinary urgency. Has been keeping well-hydrated. Denies trauma or injury to the area.  Hydration: green tea, 3-4 16 oz of water, juice. Rarely has soda or sweet tea Diet: 2 meals a day    Similar to past kidney stone 10 years ago was able to pass, [facility] reports seing several stones in both kidnesy at this time    ROS:   See pertinent positives and negatives per HPI.  Patient Active Problem List   Diagnosis Date Noted  . Family history of cancer 08/13/2018  . Reduced libido 08/13/2018  . Abdominal pain, epigastric 05/29/2017  . Gastroesophageal reflux disease 05/29/2017  . Early satiety 05/29/2017  . Gastritis 05/15/2017  . Neck pain 03/07/2017  . Visit for preventive health examination 10/22/2016  .  History of abnormal cervical Papanicolaou smear 08/13/2016  . Menopause present 06/27/2016  . History of migraine 06/26/2016    Social History   Tobacco Use  . Smoking status: Never Smoker  . Smokeless tobacco: Never Used  Substance Use Topics  . Alcohol use: No    Current Outpatient Medications:  .  cyanocobalamin (,VITAMIN B-12,) 1000 MCG/ML injection, Inject 1 mL (1,000 mcg total) into the muscle every 30 (thirty) days., Disp: 10 mL, Rfl: 3 .  DEXILANT 60 MG capsule, TAKE 1 CAPSULE BY MOUTH EVERY DAY, Disp: 90 capsule, Rfl: 1 .  Diclofenac Sodium CR 100 MG 24 hr tablet, Take 1 tablet (100 mg total) by mouth daily as needed for pain., Disp: 32 tablet, Rfl: 0 .  OnabotulinumtoxinA (BOTOX IJ), Botox, Disp: , Rfl:  .  ondansetron (ZOFRAN ODT) 4 MG disintegrating tablet, Take 1 tablet (4 mg total) by mouth every 8 (eight) hours as needed for nausea or vomiting., Disp: 20 tablet, Rfl: 0 .  Topiramate ER (TROKENDI XR) 25 MG CP24, Take 25 mg by mouth at bedtime., Disp: 30 capsule, Rfl: 5 .  VIVELLE-DOT 0.1 MG/24HR patch, APPLY 1 PATCH TWICE WEEKLY, Disp: , Rfl: 0 .  zolmitriptan (ZOMIG-ZMT) 5 MG disintegrating tablet, TAKE 1 TABLET ONCE A DAY AS NEEDED (Patient taking differently: as needed), Disp: 10 tablet, Rfl: 1 .  SUMAtriptan Succinate (ZEMBRACE SYMTOUCH) 3 MG/0.5ML SOAJ, Inject 3 mg into the skin as needed (May repeat once aftrer 1 hour if needed.). (Patient not taking: Reported on 08/29/2018), Disp: 9 pen, Rfl: 5  Allergies  Allergen Reactions  . Doxycycline Nausea And Vomiting  .  Penicillins Rash    rash  . Rocephin [Ceftriaxone] Rash    Rash    Objective:   There were no vitals taken for this visit.  Patient is well-developed, well-nourished in no acute distress.  Resting comfortably at home.  Head is normocephalic, atraumatic.  No labored breathing.  Speech is clear and coherent with logical contest.  Patient is alert and oriented at baseline.   Assessment and Plan:    1. Right flank pain 2. History of nephrolithiasis Concern for obstructive nephrolithiasis. Will obtain CT renal study. Rx hydrocodone for pain. Supportive measures discussed. Strict ER precautions reviewed. If appropriate based on results, will start medical expulsive therapy versus Urology assessment.   - CT RENAL STONE STUDY; Future - HYDROcodone-acetaminophen (NORCO) 10-325 MG tablet; Take 1 tablet by mouth every 8 (eight) hours as needed for up to 5 days.  Dispense: 15 tablet; Refill: 0 .   Piedad ClimesWilliam Cody Marybell Robards, PA-C 08/29/2018

## 2018-08-29 NOTE — Progress Notes (Signed)
I have discussed the procedure for the virtual visit with the patient who has given consent to proceed with assessment and treatment.   Mccartney Brucks S Arend Bahl, CMA     

## 2018-08-29 NOTE — Telephone Encounter (Signed)
Pt calling in with Lower right back/side pain. States that she has a history of kidney stones.  Patient denies blood in urine, denies painful urination however she does have urinary frequency and nausea. She states that this is exactly how she felt with she dealt with kidney stones about 10 years ago.   Patient agreed to virtual visit with PCP - aware if we need her to come in he will let us know.

## 2018-09-01 ENCOUNTER — Other Ambulatory Visit: Payer: Self-pay | Admitting: Emergency Medicine

## 2018-09-01 ENCOUNTER — Other Ambulatory Visit: Payer: Self-pay

## 2018-09-01 ENCOUNTER — Encounter: Payer: Self-pay | Admitting: Physician Assistant

## 2018-09-01 ENCOUNTER — Other Ambulatory Visit: Payer: Self-pay | Admitting: Physician Assistant

## 2018-09-01 ENCOUNTER — Ambulatory Visit (INDEPENDENT_AMBULATORY_CARE_PROVIDER_SITE_OTHER): Payer: Managed Care, Other (non HMO)

## 2018-09-01 DIAGNOSIS — R300 Dysuria: Secondary | ICD-10-CM

## 2018-09-01 DIAGNOSIS — N390 Urinary tract infection, site not specified: Secondary | ICD-10-CM

## 2018-09-01 LAB — POCT URINALYSIS DIPSTICK
Bilirubin, UA: NEGATIVE
Blood, UA: POSITIVE
Glucose, UA: NEGATIVE
Ketones, UA: NEGATIVE
Nitrite, UA: NEGATIVE
Protein, UA: NEGATIVE
Spec Grav, UA: <=1.005 — AB (ref 1.010–1.025)
Urobilinogen, UA: 0.2 E.U./dL
pH, UA: 6 (ref 5.0–8.0)

## 2018-09-01 MED ORDER — NITROFURANTOIN MONOHYD MACRO 100 MG PO CAPS: 100.0000 mg | ORAL_CAPSULE | Freq: Two times a day (BID) | ORAL | 0 refills | Status: DC

## 2018-09-01 NOTE — Progress Notes (Signed)
Urine with some sign of potential UTI developing presently. Giving symptoms will start her on Keflex 500 mg BID x 7 days until culture results are in.

## 2018-09-03 LAB — URINE CULTURE
MICRO NUMBER:: 617272
SPECIMEN QUALITY:: ADEQUATE

## 2018-10-06 ENCOUNTER — Other Ambulatory Visit: Payer: Self-pay | Admitting: Neurology

## 2018-10-12 IMAGING — US US ABDOMEN COMPLETE
1 series · 13 of 25 positions shown · non-contrast
Comparison: None in PACs

CLINICAL DATA: Epigastric pain since January 2017. History of
kidney stones

EXAM:
ABDOMEN ULTRASOUND COMPLETE

[Series 1: us abdomen complete · 0.20mm/px · 13 of 75 slices shown]
[im 1/75]
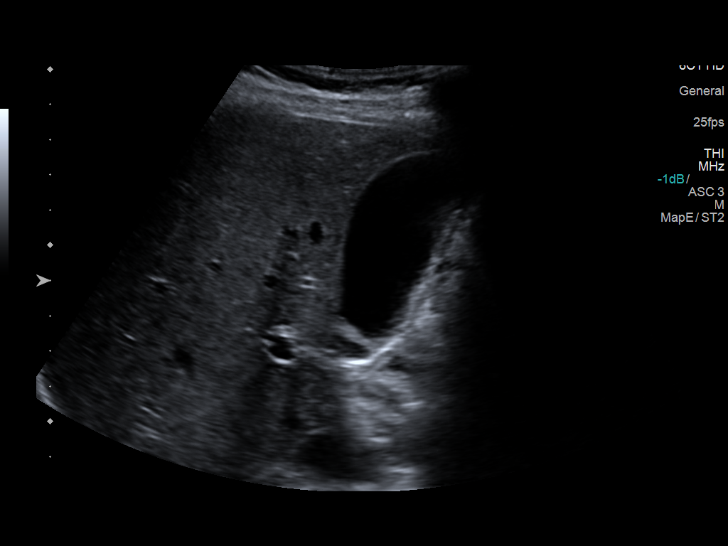
[im 7/75]
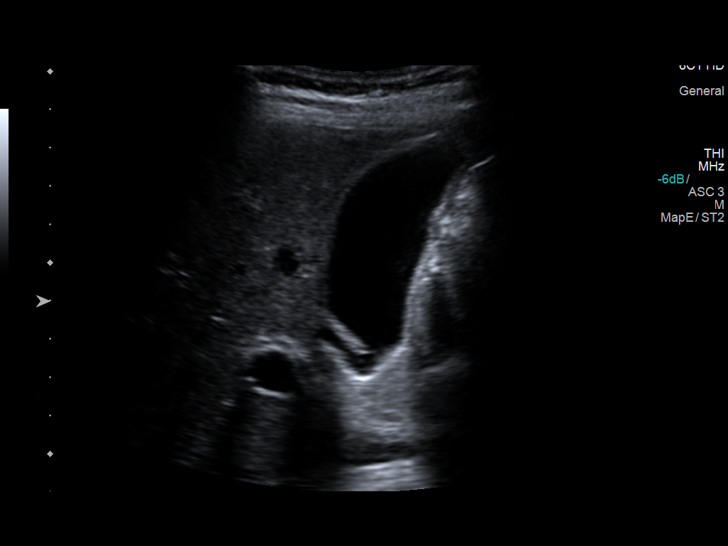
[im 13/75]
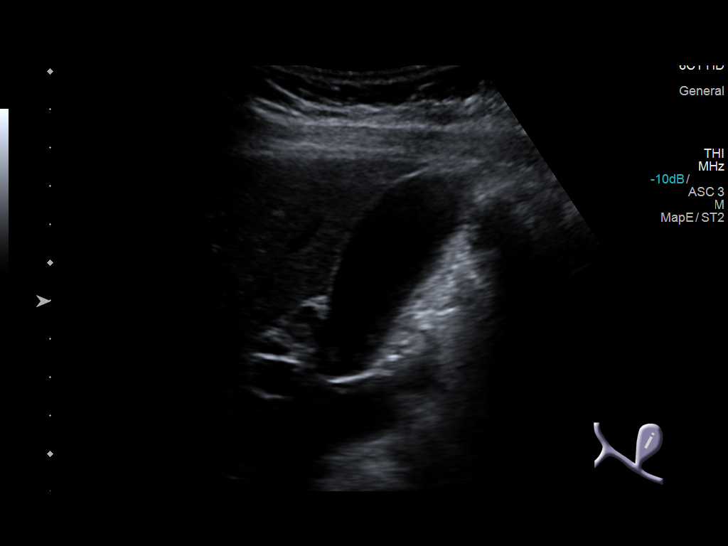
[im 19/75]
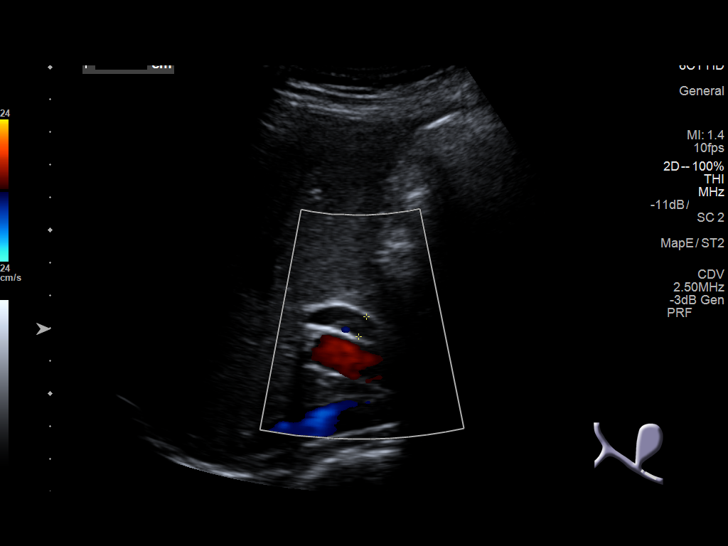
[im 25/75]
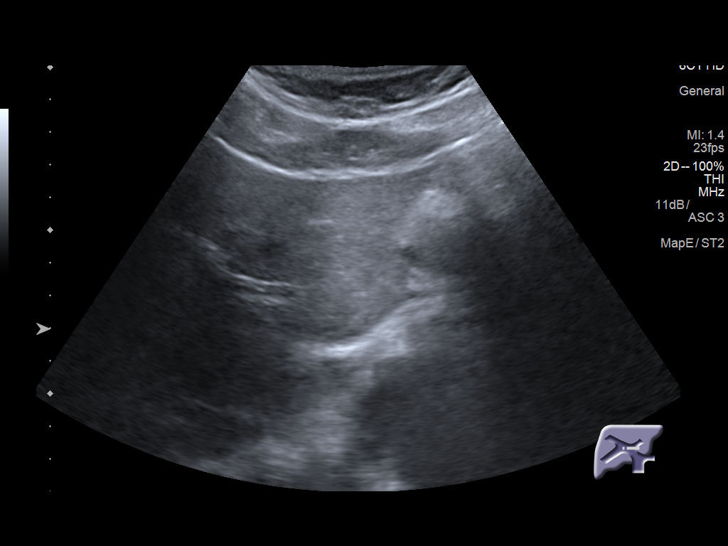
[im 31/75]
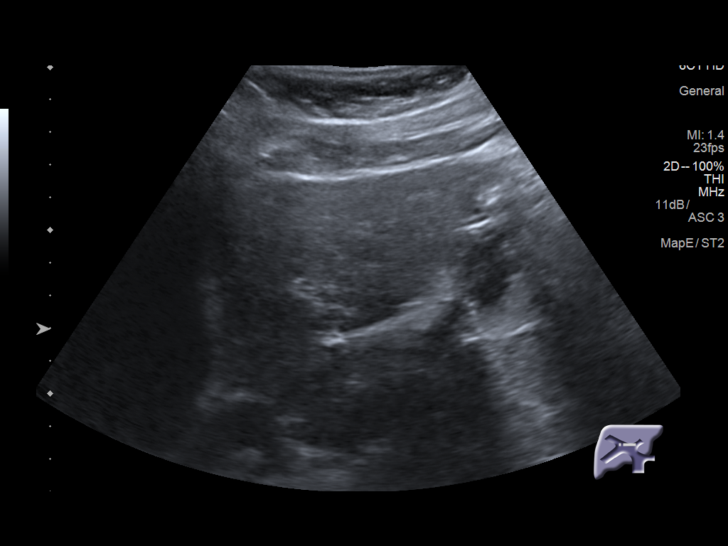
[im 38/75]
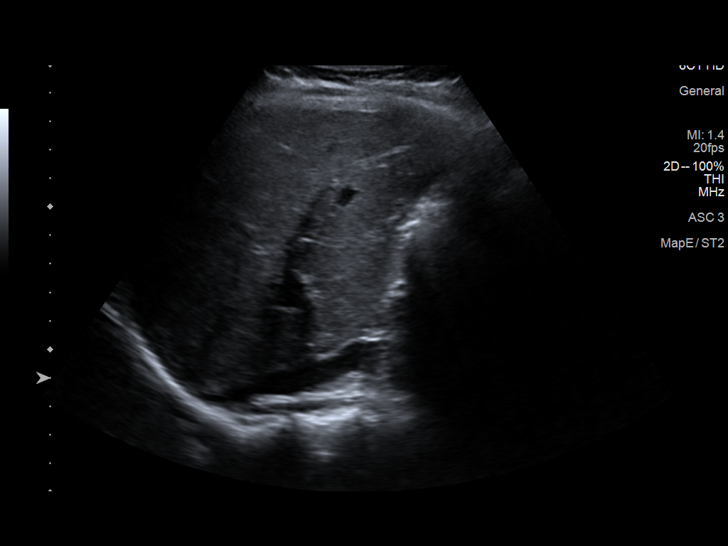
[im 44/75]
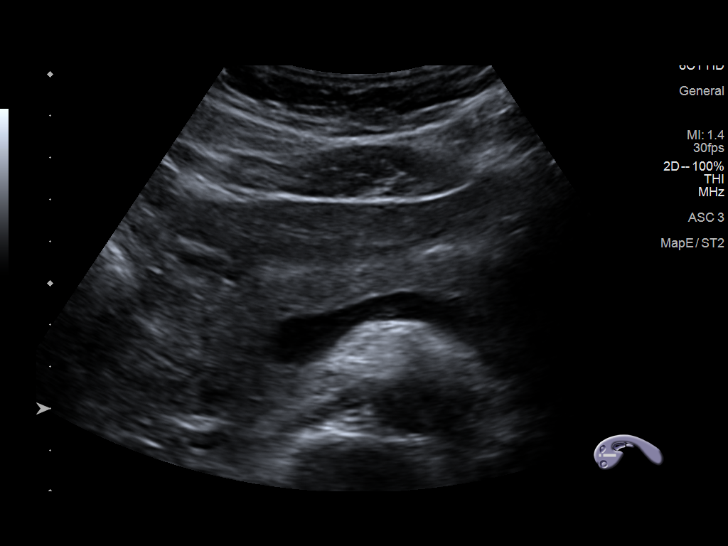
[im 50/75]
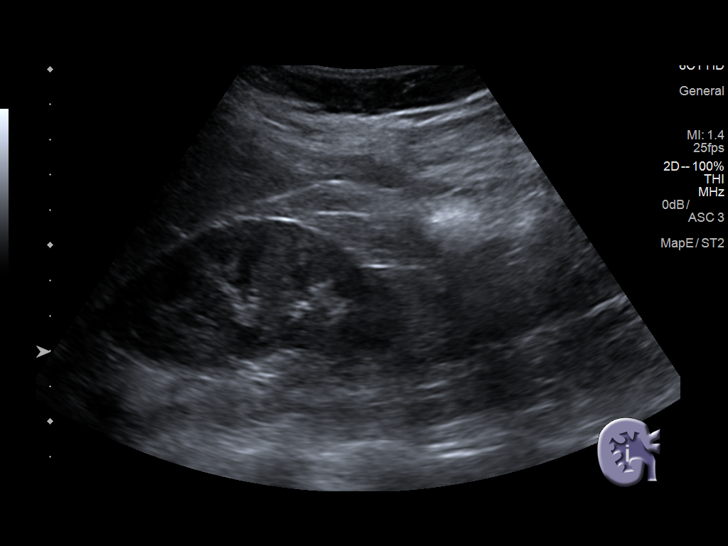
[im 56/75]
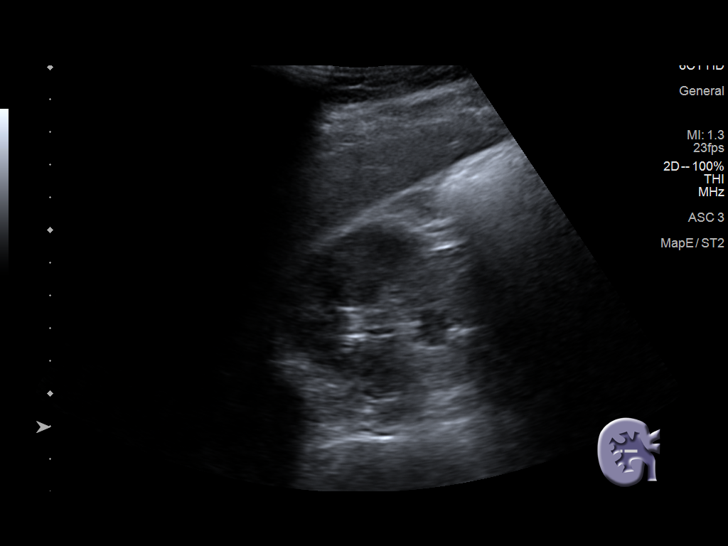
[im 62/75]
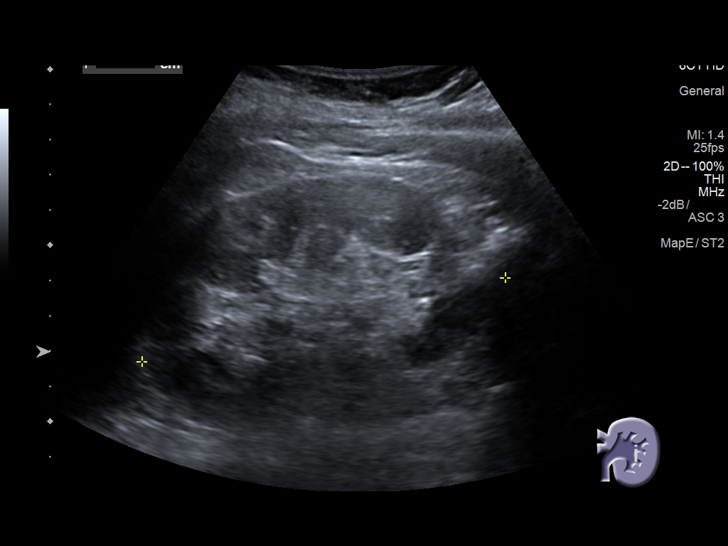
[im 68/75]
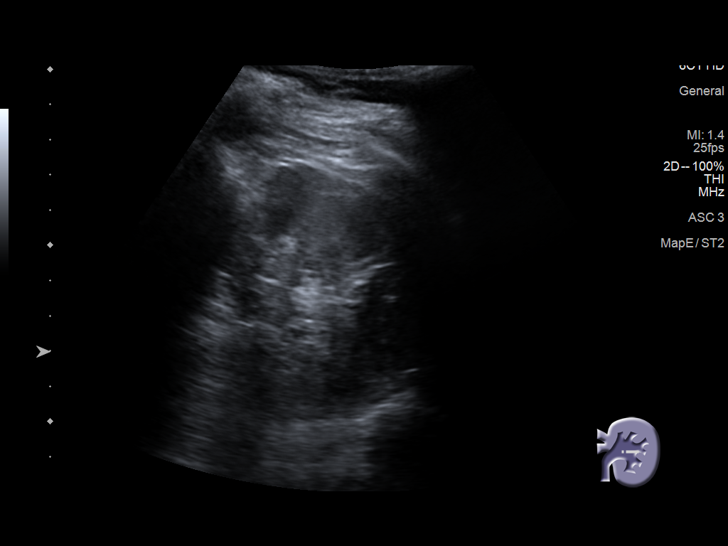
[im 75/75]
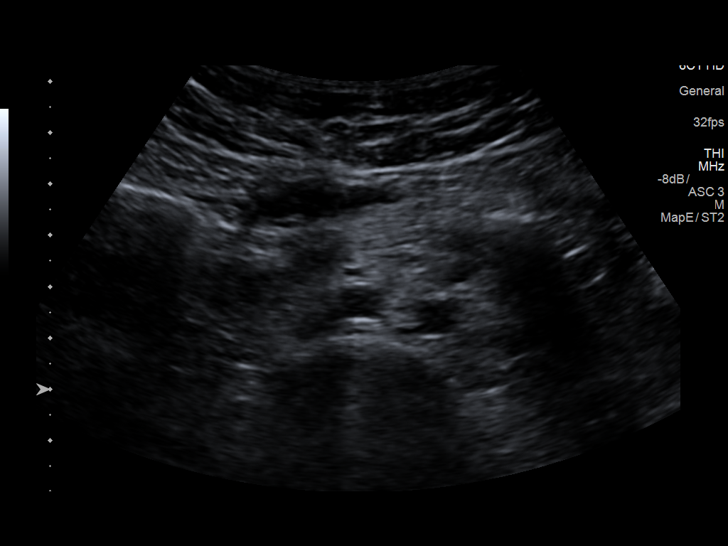

[13 of 25 positions shown; findings below may reference images not displayed]

FINDINGS: Gallbladder: No gallstones or wall thickening visualized. No
sonographic Murphy sign noted by sonographer.

Common bile duct: Diameter: 6.6 mm.  No intraluminal echoes.

Liver: No focal lesion identified. Within normal limits in
parenchymal echogenicity. Portal vein is patent on color Doppler
imaging with normal direction of blood flow towards the liver.

IVC: No abnormality visualized.

Pancreas: Visualized portion unremarkable.

Spleen: Size and appearance within normal limits.

Right Kidney: Length: 9.8 cm. Echogenicity within normal limits. No
mass or hydronephrosis visualized.

Left Kidney: Length: 10.6 cm. Echogenicity within normal limits. No
mass or hydronephrosis visualized.

Abdominal aorta: No aneurysm visualized.

Other findings: There is no ascites.
IMPRESSION: No gallstones or sonographic evidence of acute cholecystitis. Mild
common bile duct dilation at 6.6 mm without evidence of intraluminal
stones or sludge. If there are clinical concerns of chronic
cholecystitis, a nuclear medicine hepatobiliary scan with
gallbladder ejection fraction determination may be useful.

Normal appearance of the liver, pancreas, and spleen

No kidney stones.  Normal appearing kidneys.

## 2018-10-19 ENCOUNTER — Other Ambulatory Visit: Payer: Self-pay | Admitting: Neurology

## 2018-10-29 ENCOUNTER — Encounter: Payer: Self-pay | Admitting: Physician Assistant

## 2018-10-31 ENCOUNTER — Ambulatory Visit: Payer: Managed Care, Other (non HMO) | Admitting: Neurology

## 2018-10-31 ENCOUNTER — Other Ambulatory Visit: Payer: Self-pay

## 2018-10-31 DIAGNOSIS — G43709 Chronic migraine without aura, not intractable, without status migrainosus: Secondary | ICD-10-CM | POA: Diagnosis not present

## 2018-10-31 DIAGNOSIS — IMO0002 Reserved for concepts with insufficient information to code with codable children: Secondary | ICD-10-CM

## 2018-10-31 MED ORDER — ONABOTULINUMTOXINA 100 UNITS IJ SOLR: 155.0000 [IU] | Freq: Once | INTRAMUSCULAR | Status: DC

## 2018-10-31 MED ORDER — ONABOTULINUMTOXINA 100 UNITS IJ SOLR
155.0000 [IU] | Freq: Once | INTRAMUSCULAR | Status: AC
  Administered 2018-10-31: 16:00:00 155 [IU] via INTRAMUSCULAR

## 2018-10-31 NOTE — Progress Notes (Signed)

## 2018-10-31 NOTE — Progress Notes (Signed)
45 units waste

## 2018-11-04 ENCOUNTER — Other Ambulatory Visit: Payer: Self-pay | Admitting: Neurology

## 2018-11-04 MED ORDER — PREDNISONE 10 MG PO TABS: ORAL_TABLET | ORAL | 0 refills | Status: DC

## 2018-11-04 NOTE — Progress Notes (Signed)
Prednisone taper sent in.

## 2018-11-28 ENCOUNTER — Encounter: Payer: Self-pay | Admitting: Physician Assistant

## 2018-11-28 ENCOUNTER — Other Ambulatory Visit: Payer: Self-pay

## 2018-11-28 ENCOUNTER — Ambulatory Visit: Payer: Managed Care, Other (non HMO) | Admitting: Physician Assistant

## 2018-11-28 VITALS — BP 100/60 | HR 99 | Temp 98.2°F | Resp 16 | Ht 65.0 in | Wt 126.0 lb

## 2018-11-28 DIAGNOSIS — T7840XA Allergy, unspecified, initial encounter: Secondary | ICD-10-CM | POA: Diagnosis not present

## 2018-11-28 DIAGNOSIS — H539 Unspecified visual disturbance: Secondary | ICD-10-CM | POA: Diagnosis not present

## 2018-11-28 DIAGNOSIS — G43709 Chronic migraine without aura, not intractable, without status migrainosus: Secondary | ICD-10-CM | POA: Diagnosis not present

## 2018-11-28 DIAGNOSIS — Z8669 Personal history of other diseases of the nervous system and sense organs: Secondary | ICD-10-CM | POA: Diagnosis not present

## 2018-11-28 DIAGNOSIS — IMO0002 Reserved for concepts with insufficient information to code with codable children: Secondary | ICD-10-CM

## 2018-11-28 NOTE — Patient Instructions (Signed)
I have placed a new referral to a migraine specialist here in the area. I will see how quickly they can get you in. If not within 1-2 weeks I will proceed with MRI and VEP.  Ok to start the Ajovy given by previous Neurologist.  Continue other medications as directed.  Please go to the lab today for blood work.  I will call you with your results. We will alter treatment regimen(s) if indicated by your results.   Hang in there!

## 2018-11-28 NOTE — Progress Notes (Signed)
Patient presents to clinic today to discuss referral to new neurologist. Patient with history of chronic migraine and migraine variants since age of 89. Currently followed by Dr. Everlena CooperJaffe with [facility]. Is currently on a regimen of Botox. Notes worsening severity and frequency of headaches despite treatment. Notes she mentioned to Neurologist without any input from them. States she was told that this just happens but no change in treatment regimen was made. Notes it has gotten so bad that she drove back to [place] prior town in [place] and saw her previous Insurance account managereurologist. Was started on MyanmarAjovy and ubrelvy in addition to Botox. Was also told they wanted an MRI but wanted her to follow-up in [place] for this. She wants a new specialist in the area to follow-up with.  Patient also requesting test for potential shellfish allergy. Notes sometimes when she has scallops or shrimp she will notes some mild itching. Other times this is not present so wondering if this is related to shellfish or a sauce the shellfish is in.   Past Medical History:  Diagnosis Date  . Endometriosis   . Kidney stones    Cyst and Kidney Stones  . Migraines    pt states onset around the age of 48    Current Outpatient Medications on File Prior to Visit  Medication Sig Dispense Refill  . BOTOX 200 units SOLR INJECT 155 UNITS INTRAMUSCULARLY INTO HEAD AND NECK EVERY 90 DAYS, DISCARD UNUSED PORTION 1 each 2  . cyanocobalamin (,VITAMIN B-12,) 1000 MCG/ML injection Inject 1 mL (1,000 mcg total) into the muscle every 30 (thirty) days. 10 mL 3  . DEXILANT 60 MG capsule TAKE 1 CAPSULE BY MOUTH EVERY DAY 90 capsule 1  . Diclofenac Sodium CR 100 MG 24 hr tablet Take 1 tablet (100 mg total) by mouth daily as needed for pain. 32 tablet 0  . ondansetron (ZOFRAN ODT) 4 MG disintegrating tablet Take 1 tablet (4 mg total) by mouth every 8 (eight) hours as needed for nausea or vomiting. 20 tablet 0  . Topiramate ER (TROKENDI XR) 25 MG CP24 Take 25  mg by mouth at bedtime. (Patient taking differently: Take 50 mg by mouth at bedtime. ) 30 capsule 5  . VIVELLE-DOT 0.1 MG/24HR patch APPLY 1 PATCH TWICE WEEKLY  0  . zolmitriptan (ZOMIG-ZMT) 5 MG disintegrating tablet Take 1 tablet (5 mg total) by mouth as needed for migraine. May repeat in 2 hrs if needed. Max of 2 tabs in 24 hrs. 6 tablet 3   No current facility-administered medications on file prior to visit.     Allergies  Allergen Reactions  . Doxycycline Nausea And Vomiting  . Penicillins Rash    rash  . Rocephin [Ceftriaxone] Rash    Rash    Family History  Problem Relation Age of Onset  . Endometriosis Mother   . Hypertension Father   . Kidney cancer Father   . Thyroid cancer Father   . Cystic kidney disease Father   . Healthy Brother   . Healthy Brother   . Stomach cancer Neg Hx   . Colon cancer Neg Hx     Social History   Socioeconomic History  . Marital status: Married    Spouse name: Not on file  . Number of children: Not on file  . Years of education: Not on file  . Highest education level: Not on file  Occupational History  . Not on file  Social Needs  . Financial resource strain: Not on  file  . Food insecurity    Worry: Not on file    Inability: Not on file  . Transportation needs    Medical: Not on file    Non-medical: Not on file  Tobacco Use  . Smoking status: Never Smoker  . Smokeless tobacco: Never Used  Substance and Sexual Activity  . Alcohol use: No  . Drug use: No  . Sexual activity: Yes    Partners: Male    Birth control/protection: Surgical    Comment: Hysterectomy  Lifestyle  . Physical activity    Days per week: Not on file    Minutes per session: Not on file  . Stress: Not on file  Relationships  . Social Musician on phone: Not on file    Gets together: Not on file    Attends religious service: Not on file    Active member of club or organization: Not on file    Attends meetings of clubs or organizations: Not  on file    Relationship status: Not on file  Other Topics Concern  . Not on file  Social History Narrative  . Not on file  vep Review of Systems - See HPI.  All other ROS are negative.  BP 100/60   Pulse 99   Temp 98.2 F (36.8 C) (Skin)   Resp 16   Ht 5\' 5"  (1.651 m)   Wt 126 lb (57.2 kg)   SpO2 99%   BMI 20.97 kg/m   Physical Exam Vitals signs reviewed.  Constitutional:      Appearance: Normal appearance.  HENT:     Head: Normocephalic and atraumatic.  Neck:     Musculoskeletal: Neck supple.  Pulmonary:     Effort: Pulmonary effort is normal.  Neurological:     General: No focal deficit present.     Mental Status: She is alert and oriented to person, place, and time.  Psychiatric:        Mood and Affect: Mood normal.    Recent Results (from the past 2160 hour(s))  Urine Culture     Status: None   Collection Time: 09/01/18  2:11 PM   Specimen: Urine  Result Value Ref Range   MICRO NUMBER: 09/03/18    SPECIMEN QUALITY: Adequate    Sample Source URINE    STATUS: FINAL    Result:      Three or more organisms present, each greater than 10,000 CFU/mL. May represent normal flora contamination from external genitalia. No further testing is required.  POCT urinalysis dipstick     Status: Abnormal   Collection Time: 09/01/18  2:35 PM  Result Value Ref Range   Color, UA yellow    Clarity, UA clear    Glucose, UA Negative Negative   Bilirubin, UA negative    Ketones, UA negative    Spec Grav, UA <=1.005 (A) 1.010 - 1.025   Blood, UA positive    pH, UA 6.0 5.0 - 8.0   Protein, UA Negative Negative   Urobilinogen, UA 0.2 0.2 or 1.0 E.U./dL   Nitrite, UA negative    Leukocytes, UA Small (1+) (A) Negative   Appearance     Odor      Assessment/Plan: 1. History of migraine 2. Chronic migraine 3. Visual changes Referral to Headache Clinic placed further management. Ok to start the Ajovy as directed by specialist in 09/03/18. Will see about timing of appt. If not  in next 10-14 days will proceed with MRI  and VEP study at recommendation of her [place] neurologist. Strict ER precautions reviewed with patient.  - Ambulatory referral to Neurology  4. Allergic reaction, initial encounter Will check shellfish panel today. Avoid until resulting. - Allergy-Shellfish Panel-([facility])   Leeanne Rio, PA-C

## 2018-12-01 LAB — INTERPRETATION:

## 2018-12-01 LAB — ALLERGY-SHELLFISH PANEL
CLASS: 0
CLASS: 0
CLASS: 0
Clams: <0.1 kU/L
Class: 0
Crab: <0.1 kU/L
Lobster: <0.1 kU/L
Shrimp IgE: <0.1 kU/L

## 2018-12-02 NOTE — Telephone Encounter (Signed)
Does she need a new prescription or a new copay card?

## 2018-12-16 ENCOUNTER — Ambulatory Visit: Payer: Managed Care, Other (non HMO) | Admitting: Neurology

## 2018-12-18 NOTE — Telephone Encounter (Signed)
Called left voice mail for patient confirming that I reached out [facility] and had the PA under Dr. Tomi Likens for her Botox cancelled so that her new provider can call and obtain a PA for her Botox. I spoke with Patty at [facility] today @ 11:22 am and she confirmed the cancellation went through on 12/18/18 and that they would fax our office an updated PA showing that it was cancelled.

## 2019-02-06 ENCOUNTER — Ambulatory Visit: Payer: Managed Care, Other (non HMO) | Admitting: Neurology

## 2019-05-12 ENCOUNTER — Ambulatory Visit (INDEPENDENT_AMBULATORY_CARE_PROVIDER_SITE_OTHER): Payer: Managed Care, Other (non HMO) | Admitting: Physician Assistant

## 2019-05-12 ENCOUNTER — Encounter: Payer: Self-pay | Admitting: Physician Assistant

## 2019-05-12 ENCOUNTER — Other Ambulatory Visit: Payer: Self-pay

## 2019-05-12 DIAGNOSIS — B9689 Other specified bacterial agents as the cause of diseases classified elsewhere: Secondary | ICD-10-CM | POA: Diagnosis not present

## 2019-05-12 DIAGNOSIS — J019 Acute sinusitis, unspecified: Secondary | ICD-10-CM

## 2019-05-12 MED ORDER — AZITHROMYCIN 250 MG PO TABS: ORAL_TABLET | ORAL | 0 refills | Status: DC

## 2019-05-12 NOTE — Progress Notes (Signed)
I have discussed the procedure for the virtual visit with the patient who has given consent to proceed with assessment and treatment.   Pansy Ostrovsky S Courtny Bennison, CMA     

## 2019-05-12 NOTE — Patient Instructions (Signed)
Please take antibiotic as directed.  Increase fluid intake.  Use Saline nasal spray.  Take a daily multivitamin. Start OTC flonase once daily. Ok to continue your OTC medications.  Place a humidifier in the bedroom.  Please call or return clinic if symptoms are not improving.  Sinusitis Sinusitis is redness, soreness, and swelling (inflammation) of the paranasal sinuses. Paranasal sinuses are air pockets within the bones of your face (beneath the eyes, the middle of the forehead, or above the eyes). In healthy paranasal sinuses, mucus is able to drain out, and air is able to circulate through them by way of your nose. However, when your paranasal sinuses are inflamed, mucus and air can become trapped. This can allow bacteria and other germs to grow and cause infection. Sinusitis can develop quickly and last only a short time (acute) or continue over a long period (chronic). Sinusitis that lasts for more than 12 weeks is considered chronic.  CAUSES  Causes of sinusitis include:  Allergies.  Structural abnormalities, such as displacement of the cartilage that separates your nostrils (deviated septum), which can decrease the air flow through your nose and sinuses and affect sinus drainage.  Functional abnormalities, such as when the small hairs (cilia) that line your sinuses and help remove mucus do not work properly or are not present. SYMPTOMS  Symptoms of acute and chronic sinusitis are the same. The primary symptoms are pain and pressure around the affected sinuses. Other symptoms include:  Upper toothache.  Earache.  Headache.  Bad breath.  Decreased sense of smell and taste.  A cough, which worsens when you are lying flat.  Fatigue.  Fever.  Thick drainage from your nose, which often is green and may contain pus (purulent).  Swelling and warmth over the affected sinuses. DIAGNOSIS  Your caregiver will perform a physical exam. During the exam, your caregiver may:  Look in your  nose for signs of abnormal growths in your nostrils (nasal polyps).  Tap over the affected sinus to check for signs of infection.  View the inside of your sinuses (endoscopy) with a special imaging device with a light attached (endoscope), which is inserted into your sinuses. If your caregiver suspects that you have chronic sinusitis, one or more of the following tests may be recommended:  Allergy tests.  Nasal culture A sample of mucus is taken from your nose and sent to a lab and screened for bacteria.  Nasal cytology A sample of mucus is taken from your nose and examined by your caregiver to determine if your sinusitis is related to an allergy. TREATMENT  Most cases of acute sinusitis are related to a viral infection and will resolve on their own within 10 days. Sometimes medicines are prescribed to help relieve symptoms (pain medicine, decongestants, nasal steroid sprays, or saline sprays).  However, for sinusitis related to a bacterial infection, your caregiver will prescribe antibiotic medicines. These are medicines that will help kill the bacteria causing the infection.  Rarely, sinusitis is caused by a fungal infection. In theses cases, your caregiver will prescribe antifungal medicine. For some cases of chronic sinusitis, surgery is needed. Generally, these are cases in which sinusitis recurs more than 3 times per year, despite other treatments. HOME CARE INSTRUCTIONS   Drink plenty of water. Water helps thin the mucus so your sinuses can drain more easily.  Use a humidifier.  Inhale steam 3 to 4 times a day (for example, sit in the bathroom with the shower running).  Apply a warm,  moist washcloth to your face 3 to 4 times a day, or as directed by your caregiver.  Use saline nasal sprays to help moisten and clean your sinuses.  Take over-the-counter or prescription medicines for pain, discomfort, or fever only as directed by your caregiver. SEEK IMMEDIATE MEDICAL CARE IF:   You have increasing pain or severe headaches.  You have nausea, vomiting, or drowsiness.  You have swelling around your face.  You have vision problems.  You have a stiff neck.  You have difficulty breathing. MAKE SURE YOU:   Understand these instructions.  Will watch your condition.  Will get help right away if you are not doing well or get worse. Document Released: 02/19/2005 Document Revised: 05/14/2011 Document Reviewed: 03/06/2011 [facility] Patient Information 2014 [place], [place].

## 2019-05-12 NOTE — Progress Notes (Signed)
Virtual Visit via Video   I connected with patient on 05/12/19 at 10:00 AM EST by a video enabled telemedicine application and verified that I am speaking with the correct person using two identifiers.  Location patient: Home Location provider: [facility], Office Persons participating in the virtual visit: Patient, Provider, [place] (Brenda Macias)  I discussed the limitations of evaluation and management by telemedicine and the availability of in person appointments. The patient expressed understanding and agreed to proceed.  Subjective:   HPI:   Patient presents via D[employer]Me today c/o 5 days of significant sore throat and mild PND that progressed to sinus pressure, nasal congestion. Denies any fever, loss of taste or smell, GI symptoms. Denies recent travel or sick contact. Notes chest congestion this morning that improved once she got out of bed. Is taking OTC cold/sinus. Was seen at [facility] and had negative strep and COVID test. Notes some sinus pain and maxillary sinus tenderness. Also noting thicker nasal discharge this AM.   ROS:   See pertinent positives and negatives per HPI.  Patient Active Problem List   Diagnosis Date Noted  . Family history of cancer 08/13/2018  . Reduced libido 08/13/2018  . Abdominal pain, epigastric 05/29/2017  . Gastroesophageal reflux disease 05/29/2017  . Early satiety 05/29/2017  . Gastritis 05/15/2017  . Neck pain 03/07/2017  . Visit for preventive health examination 10/22/2016  . History of abnormal cervical Papanicolaou smear 08/13/2016  . Menopause present 06/27/2016  . History of migraine 06/26/2016    Social History   Tobacco Use  . Smoking status: Never Smoker  . Smokeless tobacco: Never Used  Substance Use Topics  . Alcohol use: No    Current Outpatient Medications:  .  BOTOX 200 units SOLR, INJECT 155 UNITS INTRAMUSCULARLY INTO HEAD AND NECK EVERY 90 DAYS, DISCARD UNUSED PORTION, Disp: 1 each, Rfl: 2 .   cyanocobalamin (,VITAMIN B-12,) 1000 MCG/ML injection, Inject 1 mL (1,000 mcg total) into the muscle every 30 (thirty) days., Disp: 10 mL, Rfl: 3 .  ondansetron (ZOFRAN ODT) 4 MG disintegrating tablet, Take 1 tablet (4 mg total) by mouth every 8 (eight) hours as needed for nausea or vomiting., Disp: 20 tablet, Rfl: 0 .  tiZANidine (ZANAFLEX) 4 MG tablet, Take 4 mg by mouth every 6 (six) hours as needed for muscle spasms., Disp: , Rfl:  .  VIVELLE-DOT 0.1 MG/24HR patch, APPLY 1 PATCH TWICE WEEKLY, Disp: , Rfl: 0 .  zonisamide (ZONEGRAN) 50 MG capsule, Take 75 mg by mouth at bedtime., Disp: , Rfl:   Allergies  Allergen Reactions  . Doxycycline Nausea And Vomiting  . Penicillins Rash    rash  . Rocephin [Ceftriaxone] Rash    Rash    Objective:   There were no vitals taken for this visit.  Patient is well-developed, well-nourished in no acute distress.  Resting comfortably at home.  Head is normocephalic, atraumatic.  No labored breathing.  Speech is clear and coherent with logical content.  Patient is alert and oriented at baseline.  + TTP maxillary sinuses on exam (patient performed)  Assessment and Plan:        1. Acute bacterial sinusitis Rx Azithromycin (allergic to penicillins and cephalosporins. Trying to avoid FQ).  Increase fluids.  Rest.  Saline nasal spray.  Probiotic.  Mucinex as directed.  Humidifier in bedroom. Start Flonase.  Call or return to clinic if symptoms are not improving.  - azithromycin (ZITHROMAX) 250 MG tablet; Take 2 tablets on Day 1.  Then take 1 tablet daily.  Dispense: 6 tablet; Refill: 0 .   Piedad Climes, PA-C 05/12/2019

## 2019-06-11 ENCOUNTER — Encounter: Payer: Self-pay | Admitting: Physician Assistant

## 2019-06-15 ENCOUNTER — Encounter: Payer: Self-pay | Admitting: Physician Assistant

## 2019-06-15 ENCOUNTER — Ambulatory Visit (INDEPENDENT_AMBULATORY_CARE_PROVIDER_SITE_OTHER): Payer: Managed Care, Other (non HMO) | Admitting: Physician Assistant

## 2019-06-15 ENCOUNTER — Other Ambulatory Visit: Payer: Self-pay

## 2019-06-15 VITALS — BP 110/70 | HR 92 | Temp 98.1°F | Resp 16 | Ht 65.0 in | Wt 114.0 lb

## 2019-06-15 DIAGNOSIS — M67912 Unspecified disorder of synovium and tendon, left shoulder: Secondary | ICD-10-CM | POA: Diagnosis not present

## 2019-06-15 MED ORDER — MELOXICAM 15 MG PO TABS: 15.0000 mg | ORAL_TABLET | Freq: Every day | ORAL | 0 refills | Status: DC

## 2019-06-15 NOTE — Patient Instructions (Signed)
Please avoid heavy lifting with the left arm. Avoid overhead lifting or motion as well. Ok to do limited yoga -- nothing that involves pressure on the shoulder and upper arm.   Use the Meloxicam once daily with food. Can use tylenol for breakthrough pain. I am getting you set back up with our Sports Medicine providers for further evaluation and management. You will be contacted by them within a week.  If you do not hear from them within a week, anything worsens or new symptoms develop let us know ASAP.  Hang in there!

## 2019-06-15 NOTE — Progress Notes (Signed)
Patient presents to clinic today c/o 1 year of L shoulder pain with certain movements. Denies known trauma or injury but is not sure if she may have irritated while exercising.  Notes that she has issue rotating her shoulder.  We will get a very sharp pain that is quite intense sometimes rated as a 12/10 by patient.  Dissipates after a couple seconds but is left with a throbbing pain that can last for several minutes.  Denies any numbness, tingling or weakness of extremity.  Denies bruising or swelling.  Denies any prior injury to the shoulder.  Denies any associated neck pain.  Denies symptoms of right upper extremity.  Past Medical History:  Diagnosis Date  . Endometriosis   . Kidney stones    Cyst and Kidney Stones  . Migraines    pt states onset around the age of 68    Current Outpatient Medications on File Prior to Visit  Medication Sig Dispense Refill  . BOTOX 200 units SOLR INJECT 155 UNITS INTRAMUSCULARLY INTO HEAD AND NECK EVERY 90 DAYS, DISCARD UNUSED PORTION 1 each 2  . cyanocobalamin (,VITAMIN B-12,) 1000 MCG/ML injection Inject 1 mL (1,000 mcg total) into the muscle every 30 (thirty) days. 10 mL 3  . ondansetron (ZOFRAN ODT) 4 MG disintegrating tablet Take 1 tablet (4 mg total) by mouth every 8 (eight) hours as needed for nausea or vomiting. 20 tablet 0  . tiZANidine (ZANAFLEX) 4 MG tablet Take 4 mg by mouth every 6 (six) hours as needed for muscle spasms.    Marland Kitchen VIVELLE-DOT 0.1 MG/24HR patch APPLY 1 PATCH TWICE WEEKLY  0  . zonisamide (ZONEGRAN) 50 MG capsule Take 75 mg by mouth at bedtime.     No current facility-administered medications on file prior to visit.    Allergies  Allergen Reactions  . Doxycycline Nausea And Vomiting  . Penicillins Rash    rash  . Rocephin [Ceftriaxone] Rash    Rash    Family History  Problem Relation Age of Onset  . Endometriosis Mother   . Hypertension Father   . Kidney cancer Father   . Thyroid cancer Father   . Cystic kidney  disease Father   . Healthy Brother   . Healthy Brother   . Stomach cancer Neg Hx   . Colon cancer Neg Hx     Social History   Socioeconomic History  . Marital status: Married    Spouse name: Not on file  . Number of children: Not on file  . Years of education: Not on file  . Highest education level: Not on file  Occupational History  . Not on file  Tobacco Use  . Smoking status: Never Smoker  . Smokeless tobacco: Never Used  Substance and Sexual Activity  . Alcohol use: No  . Drug use: No  . Sexual activity: Yes    Partners: Male    Birth control/protection: Surgical    Comment: Hysterectomy  Other Topics Concern  . Not on file  Social History Narrative  . Not on file   Social Determinants of Health   Financial Resource Strain:   . Difficulty of Paying Living Expenses:   Food Insecurity:   . Worried About Charity fundraiser in the Last Year:   . Arboriculturist in the Last Year:   Transportation Needs:   . Film/video editor (Medical):   Marland Kitchen Lack of Transportation (Non-Medical):   Physical Activity:   . Days of Exercise per  Week:   . Minutes of Exercise per Session:   Stress:   . Feeling of Stress :   Social Connections:   . Frequency of Communication with Friends and Family:   . Frequency of Social Gatherings with Friends and Family:   . Attends Religious Services:   . Active Member of Clubs or Organizations:   . Attends Banker Meetings:   Marland Kitchen Marital Status:    Review of Systems - See HPI.  All other ROS are negative.  BP 110/70   Pulse 92   Temp 98.1 F (36.7 C) (Temporal)   Resp 16   Ht 5\' 5"  (1.651 m)   Wt 114 lb (51.7 kg)   SpO2 99%   BMI 18.97 kg/m   Physical Exam Vitals reviewed.  Constitutional:      Appearance: Normal appearance.  HENT:     Head: Normocephalic and atraumatic.     Nose: Nose normal.  Musculoskeletal:     Left shoulder: No swelling, deformity, tenderness or bony tenderness. Decreased range of motion  (secondary to pain with IR and OR).     Cervical back: Neck supple.     Comments: IR and OR impaired secondary to pain. Abduction impaired past 90 degrees due to pain. Normal strength but pain with resistance applied.   Neurological:     Mental Status: She is alert.    Assessment/Plan: 1. Tendinopathy of left rotator cuff Tendinopathy and concern for partial tear in rotator cuff.  Given chronicity of symptoms referral to sports medicine place.  We will have her rest the arm until this assessment.  Start meloxicam once daily.  Tylenol for breakthrough pain if needed.  Further management per specialist. - Ambulatory referral to Sports Medicine   , PA-C

## 2019-06-23 ENCOUNTER — Ambulatory Visit: Payer: Managed Care, Other (non HMO) | Admitting: Family Medicine

## 2019-07-08 ENCOUNTER — Other Ambulatory Visit: Payer: Self-pay | Admitting: Physician Assistant

## 2019-07-08 ENCOUNTER — Telehealth: Payer: Self-pay | Admitting: Physician Assistant

## 2019-07-08 ENCOUNTER — Emergency Department (HOSPITAL_COMMUNITY): Payer: Managed Care, Other (non HMO)

## 2019-07-08 ENCOUNTER — Other Ambulatory Visit: Payer: Self-pay

## 2019-07-08 ENCOUNTER — Emergency Department (HOSPITAL_COMMUNITY)
Admission: EM | Admit: 2019-07-08 | Discharge: 2019-07-08 | Disposition: A | Discharge status: Home / Self Care | Payer: Managed Care, Other (non HMO) | Attending: Emergency Medicine | Admitting: Emergency Medicine

## 2019-07-08 ENCOUNTER — Encounter (HOSPITAL_COMMUNITY): Payer: Self-pay | Admitting: Emergency Medicine

## 2019-07-08 DIAGNOSIS — J111 Influenza due to unidentified influenza virus with other respiratory manifestations: Secondary | ICD-10-CM | POA: Insufficient documentation

## 2019-07-08 DIAGNOSIS — T50B95A Adverse effect of other viral vaccines, initial encounter: Secondary | ICD-10-CM | POA: Diagnosis not present

## 2019-07-08 DIAGNOSIS — R0602 Shortness of breath: Secondary | ICD-10-CM | POA: Diagnosis present

## 2019-07-08 DIAGNOSIS — T50Z95A Adverse effect of other vaccines and biological substances, initial encounter: Secondary | ICD-10-CM

## 2019-07-08 DIAGNOSIS — E538 Deficiency of other specified B group vitamins: Secondary | ICD-10-CM

## 2019-07-08 DIAGNOSIS — Z79899 Other long term (current) drug therapy: Secondary | ICD-10-CM | POA: Insufficient documentation

## 2019-07-08 DIAGNOSIS — R6889 Other general symptoms and signs: Secondary | ICD-10-CM

## 2019-07-08 LAB — TROPONIN I (HIGH SENSITIVITY)
Troponin I (High Sensitivity): <2 ng/L (ref <18)
Troponin I (High Sensitivity): <2 ng/L (ref <18)

## 2019-07-08 LAB — BASIC METABOLIC PANEL
Anion gap: 10 (ref 5–15)
BUN: 11 mg/dL (ref 6–20)
CO2: 20 mmol/L — ABNORMAL LOW (ref 22–32)
Calcium: 9.4 mg/dL (ref 8.9–10.3)
Chloride: 107 mmol/L (ref 98–111)
Creatinine, Ser: 0.86 mg/dL (ref 0.44–1.00)
GFR calc Af Amer: >60 mL/min (ref >60)
GFR calc non Af Amer: >60 mL/min (ref >60)
Glucose, Bld: 141 mg/dL — ABNORMAL HIGH (ref 70–99)
Potassium: 3.5 mmol/L (ref 3.5–5.1)
Sodium: 137 mmol/L (ref 135–145)

## 2019-07-08 LAB — CBC
HCT: 36.7 % (ref 36.0–46.0)
Hemoglobin: 12.9 g/dL (ref 12.0–15.0)
MCH: 30.6 pg (ref 26.0–34.0)
MCHC: 35.1 g/dL (ref 30.0–36.0)
MCV: 87 fL (ref 80.0–100.0)
Platelets: 133 10*3/uL — ABNORMAL LOW (ref 150–400)
RBC: 4.22 MIL/uL (ref 3.87–5.11)
RDW: 12.4 % (ref 11.5–15.5)
WBC: 7 10*3/uL (ref 4.0–10.5)
nRBC: 0 % (ref 0.0–0.2)

## 2019-07-08 LAB — D-DIMER, QUANTITATIVE: D-Dimer, Quant: 0.39 ug/mL-FEU (ref 0.00–0.50)

## 2019-07-08 LAB — I-STAT BETA HCG BLOOD, ED (MC, WL, AP ONLY): I-stat hCG, quantitative: <5 m[IU]/mL (ref <5)

## 2019-07-08 MED ORDER — KETOROLAC TROMETHAMINE 15 MG/ML IJ SOLN
15.0000 mg | Freq: Once | INTRAMUSCULAR | Status: AC
  Administered 2019-07-08: 15 mg via INTRAVENOUS
  Filled 2019-07-08: qty 1

## 2019-07-08 MED ORDER — ONDANSETRON HCL 4 MG/2ML IJ SOLN
4.0000 mg | Freq: Once | INTRAMUSCULAR | Status: AC
  Administered 2019-07-08: 4 mg via INTRAVENOUS
  Filled 2019-07-08: qty 2

## 2019-07-08 MED ORDER — SODIUM CHLORIDE 0.9% FLUSH
3.0000 mL | Freq: Once | INTRAVENOUS | Status: AC
  Administered 2019-07-08: 3 mL via INTRAVENOUS

## 2019-07-08 MED ORDER — SODIUM CHLORIDE 0.9 % IV BOLUS
1000.0000 mL | Freq: Once | INTRAVENOUS | Status: AC
  Administered 2019-07-08: 1000 mL via INTRAVENOUS

## 2019-07-08 NOTE — ED Notes (Signed)
Pt given gingerale and crackers for PO challenge

## 2019-07-08 NOTE — ED Notes (Signed)
Discharge instructions reviewed with pt. Pt verbalized understanding.   

## 2019-07-08 NOTE — Telephone Encounter (Signed)
Patient states she was in the ED this morning for a reaction to recent COVID Vac.  Patient states she is having abdominal pain.   Patient refused triage requesting a call back for [facility] or his assistant.

## 2019-07-08 NOTE — Telephone Encounter (Signed)
Cyanocobalamin 1093mcg/ml- last filled 04/11/19 #10 with 3 refills meloxicam - Last filled 06/15/19 #30 with no refills Last office visit 06/15/19 No future appt scheduled B12 level was elevated back in 04/2018

## 2019-07-08 NOTE — Discharge Instructions (Addendum)
You were seen today for flulike symptoms and shortness of breath.  Your work-up is reassuring.  This is likely related to your second dose of Covid vaccination.  Make sure to stay hydrated at home.  Take ibuprofen every 6 hours as needed for body aches and pains.

## 2019-07-08 NOTE — Telephone Encounter (Signed)
Spoke with patient regarding her ED visit. Patient states she was told upon discharge she could take ibuprofen for her symptoms. She states she also took zofran but was asking how often she could take it. Per directions in her chart, I informed patient she was able to take it every 8 hours as needed. Patient voiced understanding and states she will call the office to schedule a lab visit to check her b12 levels.

## 2019-07-08 NOTE — ED Triage Notes (Signed)
Patient reports SOB with chest discomfort and mid back pain this evening , denies cough or fever.

## 2019-07-08 NOTE — ED Provider Notes (Signed)
[facility] EMERGENCY DEPARTMENT Provider Note   CSN: [id number] Arrival date & time: 07/08/19  0057     History Chief Complaint  Patient presents with  . Shortness of Breath    Brenda Macias is a 49 y.o. female.  HPI     This a 49 year old female with a history of kidney stones and migraines who presents with myalgias and shortness of breath.  Patient reports that she got her second dose of Moderna vaccination yesterday morning.  Since that time she has developed back pain, arm pain, paresthesias in her hands, abdominal soreness and shortness of breath.  She thought her symptoms were related to the vaccination.  She is not had any chest pain but was alarmed at how sharp and intense her back pains had become as well as her shortness of breath.  She denies any lower extremity swelling.  No history of blood clots.  Has not had any fever or cough.  Patient took Tylenol without significant relief.  She states she felt very anxious about her symptoms and "I may have had a panic attack."  Patient denies any weakness or numbness of the lower extremities.  Denies any bowel or bladder difficulties.  She rates her pain right now 8 out of 10.  Past Medical History:  Diagnosis Date  . Endometriosis   . Kidney stones    Cyst and Kidney Stones  . Migraines    pt states onset around the age of 47    Patient Active Problem List   Diagnosis Date Noted  . Family history of cancer 08/13/2018  . Reduced libido 08/13/2018  . Abdominal pain, epigastric 05/29/2017  . Gastroesophageal reflux disease 05/29/2017  . Early satiety 05/29/2017  . Gastritis 05/15/2017  . Neck pain 03/07/2017  . Visit for preventive health examination 10/22/2016  . History of abnormal cervical Papanicolaou smear 08/13/2016  . Menopause present 06/27/2016  . History of migraine 06/26/2016    Past Surgical History:  Procedure Laterality Date  . LAPAROSCOPIC VAGINAL HYSTERECTOMY       OB History    No obstetric history on file.     Family History  Problem Relation Age of Onset  . Endometriosis Mother   . Hypertension Father   . Kidney cancer Father   . Thyroid cancer Father   . Cystic kidney disease Father   . Healthy Brother   . Healthy Brother   . Stomach cancer Neg Hx   . Colon cancer Neg Hx     Social History   Tobacco Use  . Smoking status: Never Smoker  . Smokeless tobacco: Never Used  Substance Use Topics  . Alcohol use: No  . Drug use: No    Home Medications Prior to Admission medications   Medication Sig Start Date End Date Taking? Authorizing Provider  cyanocobalamin (,VITAMIN B-12,) 1000 MCG/ML injection Inject 1 mL (1,000 mcg total) into the muscle every 30 (thirty) days. 04/11/18  Yes Waldon Merl, PA-C  tiZANidine (ZANAFLEX) 4 MG tablet Take 4 mg by mouth every 6 (six) hours as needed for muscle spasms.   Yes [provider]  VIVELLE-DOT 0.1 MG/24HR patch Place 1 patch onto the skin 2 (two) times a week. Monday and friday 05/28/16  Yes [provider]  zonisamide (ZONEGRAN) 50 MG capsule Take 75 mg by mouth at bedtime.   Yes [provider]  BOTOX 200 units SOLR INJECT 155 UNITS INTRAMUSCULARLY INTO HEAD AND NECK EVERY 90 DAYS, DISCARD UNUSED PORTION  Patient not taking: Reported on 07/08/2019 10/06/18   Pieter Partridge, DO  meloxicam (MOBIC) 15 MG tablet Take 1 tablet (15 mg total) by mouth daily. Patient not taking: Reported on 07/08/2019 06/15/19   Brunetta Jeans, PA-C  ondansetron (ZOFRAN ODT) 4 MG disintegrating tablet Take 1 tablet (4 mg total) by mouth every 8 (eight) hours as needed for nausea or vomiting. Patient not taking: Reported on 07/08/2019 05/28/17   Lorayne Bender, PA-C    Allergies    Doxycycline, Penicillins, and Rocephin [ceftriaxone]  Review of Systems   Review of Systems  Constitutional: Negative for fever.  Respiratory: Positive for shortness of breath. Negative for cough and wheezing.   Cardiovascular:  Negative for chest pain.  Gastrointestinal: Positive for abdominal pain. Negative for diarrhea, nausea and vomiting.  Genitourinary: Negative for dysuria.  Musculoskeletal: Positive for back pain.  Neurological: Positive for numbness. Negative for weakness.  All other systems reviewed and are negative.   Physical Exam Updated Vital Signs BP 114/66   Pulse (!) 102   Temp 97.7 F (36.5 C) (Oral)   Resp (!) 25   Ht 1.651 m (5\' 5" )   Wt 55 kg   SpO2 100%   BMI 20.18 kg/m   Physical Exam Vitals and nursing note reviewed.  Constitutional:      Appearance: She is well-developed. She is not ill-appearing.  HENT:     Head: Normocephalic and atraumatic.  Eyes:     Pupils: Pupils are equal, round, and reactive to light.  Cardiovascular:     Rate and Rhythm: Normal rate and regular rhythm.     Heart sounds: Normal heart sounds.  Pulmonary:     Effort: Pulmonary effort is normal. No respiratory distress.     Breath sounds: No wheezing.  Abdominal:     General: Bowel sounds are normal.     Palpations: Abdomen is soft.     Tenderness: There is no abdominal tenderness. There is no guarding or rebound.  Musculoskeletal:     Cervical back: Neck supple.     Right lower leg: No tenderness. No edema.     Left lower leg: No tenderness. No edema.  Skin:    General: Skin is warm and dry.  Neurological:     Mental Status: She is alert and oriented to person, place, and time.  Psychiatric:        Mood and Affect: Mood normal.     ED Results / Procedures / Treatments   Labs (all labs ordered are listed, but only abnormal results are displayed) Labs Reviewed  BASIC METABOLIC PANEL - Abnormal; Notable for the following components:      Result Value   CO2 20 (*)    Glucose, Bld 141 (*)    All other components within normal limits  CBC - Abnormal; Notable for the following components:   Platelets 133 (*)    All other components within normal limits  D-DIMER, QUANTITATIVE (NOT AT  [facility])  I-STAT BETA HCG BLOOD, ED ([facility], [facility], [facility] ONLY)  TROPONIN I (HIGH SENSITIVITY)  TROPONIN I (HIGH SENSITIVITY)    EKG EKG Interpretation  Date/Time:  Wednesday Jul 08 2019 01:06:16 EDT Ventricular Rate:  114 PR Interval:  132 QRS Duration: 66 QT Interval:  320 QTC Calculation: 441 R Axis:   89 Text Interpretation: Sinus tachycardia Nonspecific T wave abnormality Abnormal ECG NO STEMI. No old tracing to compare Confirmed by Addison Lank [phone]) on 07/08/2019 1:13:54 AM   Radiology DG  Chest 2 View  Result Date: 07/08/2019 CLINICAL DATA:  Shortness of breath, limb numbness EXAM: CHEST - 2 VIEW COMPARISON:  None. FINDINGS: No consolidation, features of edema, pneumothorax, or effusion. Pulmonary vascularity is normally distributed. The cardiomediastinal contours are unremarkable. No acute osseous or soft tissue abnormality. IMPRESSION: No acute cardiopulmonary abnormality. Electronically Signed   By: Kreg Shropshire M.D.   On: 07/08/2019 02:25    Procedures Procedures (including critical care time)  Medications Ordered in ED Medications  sodium chloride flush (NS) 0.9 % injection 3 mL (3 mLs Intravenous Given 07/08/19 0242)  sodium chloride 0.9 % bolus 1,000 mL (1,000 mLs Intravenous New Bag/Given 07/08/19 0238)  ondansetron (ZOFRAN) injection 4 mg (4 mg Intravenous Given 07/08/19 0235)  ketorolac (TORADOL) 15 MG/ML injection 15 mg (15 mg Intravenous Given 07/08/19 0329)    ED Course  I have reviewed the triage vital signs and the nursing notes.  Pertinent labs & imaging results that were available during my care of the patient were reviewed by me and considered in my medical decision making (see chart for details).    MDM Rules/Calculators/A&P                       Patient presents with onset of myalgias, paresthesias, shortness of breath.  This is in the setting of recently receiving her second Covid vaccination.  Initially she is tachycardic and tachypneic.  She is afebrile.  On my  evaluation she appears more comfortable and vital signs notable for mild tachycardia in the low 100s.  Considerations include vaccine reaction, pneumonia, PE.  No signs or symptoms of cauda equina.  No CVA tenderness.  No urinary symptoms to suggest UTI.  EKG shows no evidence of acute ischemia.  Chest x-ray is without pneumothorax or pneumonia.  Troponin is initially negative.  Doubt ACS.  Screening D-dimer obtained given tachycardia and shortness of breath.  Screening D-dimer is negative.  Low suspicion for PE.  Patient given fluids and Toradol.  Highly suspect that her symptoms are related to recent second dose of her vaccine.  She is clinically well-appearing.  Recommend ibuprofen every 6 hours as needed and patient was instructed to stay hydrated.  After history, exam, and medical workup I feel the patient has been appropriately medically screened and is safe for discharge home. Pertinent diagnoses were discussed with the patient. Patient was given return precautions.   Final Clinical Impression(s) / ED Diagnoses Final diagnoses:  Flu-like symptoms  Adverse effect of vaccine, initial encounter    Rx / DC Orders ED Discharge Orders    None       Breahna Boylen, Mayer Masker, MD 07/08/19 [phone]

## 2019-07-09 ENCOUNTER — Other Ambulatory Visit (INDEPENDENT_AMBULATORY_CARE_PROVIDER_SITE_OTHER): Payer: Managed Care, Other (non HMO)

## 2019-07-09 DIAGNOSIS — E538 Deficiency of other specified B group vitamins: Secondary | ICD-10-CM

## 2019-07-10 ENCOUNTER — Other Ambulatory Visit: Payer: Self-pay

## 2019-07-10 DIAGNOSIS — E538 Deficiency of other specified B group vitamins: Secondary | ICD-10-CM

## 2019-07-10 LAB — VITAMIN B12: Vitamin B-12: 584 pg/mL (ref 211–911)

## 2019-07-10 MED ORDER — CYANOCOBALAMIN 1000 MCG/ML IJ SOLN: 1000.0000 ug | INTRAMUSCULAR | 3 refills | Status: AC

## 2019-08-25 ENCOUNTER — Other Ambulatory Visit: Payer: Self-pay

## 2019-08-25 ENCOUNTER — Ambulatory Visit: Payer: Managed Care, Other (non HMO) | Admitting: Physician Assistant

## 2019-08-25 ENCOUNTER — Encounter: Payer: Self-pay | Admitting: Physician Assistant

## 2019-08-25 VITALS — BP 102/60 | HR 80 | Temp 97.7°F | Ht 65.0 in | Wt 119.2 lb

## 2019-08-25 DIAGNOSIS — L249 Irritant contact dermatitis, unspecified cause: Secondary | ICD-10-CM

## 2019-08-25 MED ORDER — TRIAMCINOLONE ACETONIDE 0.1 % EX CREA: 1.0000 "application " | TOPICAL_CREAM | Freq: Two times a day (BID) | CUTANEOUS | 0 refills | Status: AC

## 2019-08-25 NOTE — Patient Instructions (Signed)
Please keep skin clean and dry. Can apply topical Witch Hazel astringent to the area to help dry area and soothe the skin. Can also use OTC Sarna lotion as well.  I have sent in a script for Kenalog cream to apply twice daily as directed over the next 7-10 days until rash resolves.   If you note any new or worsening symptoms, please let me know ASAP.    Contact Dermatitis Dermatitis is redness, soreness, and swelling (inflammation) of the skin. Contact dermatitis is a reaction to something that touches the skin. There are two types of contact dermatitis:  Irritant contact dermatitis. This happens when something bothers (irritates) your skin, like soap.  Allergic contact dermatitis. This is caused when you are exposed to something that you are allergic to, such as poison ivy. What are the causes?  Common causes of irritant contact dermatitis include: ? Makeup. ? Soaps. ? Detergents. ? Bleaches. ? Acids. ? Metals, such as nickel.  Common causes of allergic contact dermatitis include: ? Plants. ? Chemicals. ? Jewelry. ? Latex. ? Medicines. ? Preservatives in products, such as clothing. What increases the risk?  Having a job that exposes you to things that bother your skin.  Having asthma or eczema. What are the signs or symptoms? Symptoms may happen anywhere the irritant has touched your skin. Symptoms include:  Dry or flaky skin.  Redness.  Cracks.  Itching.  Pain or a burning feeling.  Blisters.  Blood or clear fluid draining from skin cracks. With allergic contact dermatitis, swelling may occur. This may happen in places such as the eyelids, mouth, or genitals. How is this treated?  This condition is treated by checking for the cause of the reaction and protecting your skin. Treatment may also include: ? Steroid creams, ointments, or medicines. ? Antibiotic medicines or other ointments, if you have a skin infection. ? Lotion or medicines to help with  itching. ? A bandage (dressing). Follow these instructions at home: Skin care  Moisturize your skin as needed.  Put cool cloths on your skin.  Put a baking soda paste on your skin. Stir water into baking soda until it looks like a paste.  Do not scratch your skin.  Avoid having things rub up against your skin.  Avoid the use of soaps, perfumes, and dyes. Medicines  Take or apply over-the-counter and prescription medicines only as told by your doctor.  If you were prescribed an antibiotic medicine, take or apply it as told by your doctor. Do not stop using it even if your condition starts to get better. Bathing  Take a bath with: ? Epsom salts. ? Baking soda. ? Colloidal oatmeal.  Bathe less often.  Bathe in warm water. Avoid using hot water. Bandage care  If you were given a bandage, change it as told by your health care provider.  Wash your hands with soap and water before and after you change your bandage. If soap and water are not available, use hand sanitizer. General instructions  Avoid the things that caused your reaction. If you do not know what caused it, keep a journal. Write down: ? What you eat. ? What skin products you use. ? What you drink. ? What you wear in the area that has symptoms. This includes jewelry.  Check the affected areas every day for signs of infection. Check for: ? More redness, swelling, or pain. ? More fluid or blood. ? Warmth. ? Pus or a bad smell.  Keep all follow-up  visits as told by your doctor. This is important. Contact a doctor if:  You do not get better with treatment.  Your condition gets worse.  You have signs of infection, such as: ? More swelling. ? Tenderness. ? More redness. ? Soreness. ? Warmth.  You have a fever.  You have new symptoms. Get help right away if:  You have a very bad headache.  You have neck pain.  Your neck is stiff.  You throw up (vomit).  You feel very sleepy.  You see red  streaks coming from the area.  Your bone or joint near the area hurts after the skin has healed.  The area turns darker.  You have trouble breathing. Summary  Dermatitis is redness, soreness, and swelling of the skin.  Symptoms may occur where the irritant has touched you.  Treatment may include medicines and skin care.  If you do not know what caused your reaction, keep a journal.  Contact a doctor if your condition gets worse or you have signs of infection. This information is not intended to replace advice given to you by your health care provider. Make sure you discuss any questions you have with your health care provider. Document Revised: 06/11/2018 Document Reviewed: 09/04/2017 Elsevier Patient Education  [place].

## 2019-08-25 NOTE — Progress Notes (Signed)
Patient presents to clinic today c/o itchy rash to the anterior left forearm over the past several days. Patent endorses initially noting 2 red spots that have since spread further up the arm.  Denies any pain or tenderness at site.  Denies any weeping from the area.  Denies any known trauma or injury.  States symptoms first noted after she had spent the day cleaning her father-in-law's basement, noting she wore rubber gloves that cut out just underneath where the the spots began.  Notes she was around different substances including some chemicals.  Denies any noted exposure to rhus plants.  Past Medical History:  Diagnosis Date  . Endometriosis   . Kidney stones    Cyst and Kidney Stones  . Migraines    pt states onset around the age of 48    Current Outpatient Medications on File Prior to Visit  Medication Sig Dispense Refill  . cyanocobalamin (,VITAMIN B-12,) 1000 MCG/ML injection Inject 1 mL (1,000 mcg total) into the muscle every 30 (thirty) days. 10 mL 3  . ondansetron (ZOFRAN ODT) 4 MG disintegrating tablet Take 1 tablet (4 mg total) by mouth every 8 (eight) hours as needed for nausea or vomiting. 20 tablet 0  . tiZANidine (ZANAFLEX) 4 MG tablet Take 4 mg by mouth every 6 (six) hours as needed for muscle spasms.    Marland Kitchen VIVELLE-DOT 0.1 MG/24HR patch Place 1 patch onto the skin 2 (two) times a week. Monday and friday  0  . zonisamide (ZONEGRAN) 50 MG capsule Take 75 mg by mouth at bedtime.    . meloxicam (MOBIC) 15 MG tablet TAKE 1 TABLET BY MOUTH EVERY DAY 30 tablet 0   No current facility-administered medications on file prior to visit.    Allergies  Allergen Reactions  . Doxycycline Nausea And Vomiting  . Penicillins Rash    rash  . Rocephin [Ceftriaxone] Rash    Rash    Family History  Problem Relation Age of Onset  . Endometriosis Mother   . Hypertension Father   . Kidney cancer Father   . Thyroid cancer Father   . Cystic kidney disease Father   . Healthy Brother     . Healthy Brother   . Stomach cancer Neg Hx   . Colon cancer Neg Hx     Social History   Socioeconomic History  . Marital status: Married    Spouse name: Not on file  . Number of children: Not on file  . Years of education: Not on file  . Highest education level: Not on file  Occupational History  . Not on file  Tobacco Use  . Smoking status: Never Smoker  . Smokeless tobacco: Never Used  Vaping Use  . Vaping Use: Never used  Substance and Sexual Activity  . Alcohol use: No  . Drug use: No  . Sexual activity: Yes    Partners: Male    Birth control/protection: Surgical    Comment: Hysterectomy  Other Topics Concern  . Not on file  Social History Narrative  . Not on file   Social Determinants of Health   Financial Resource Strain:   . Difficulty of Paying Living Expenses:   Food Insecurity:   . Worried About Programme researcher, broadcasting/film/video in the Last Year:   . Barista in the Last Year:   Transportation Needs:   . Freight forwarder (Medical):   Marland Kitchen Lack of Transportation (Non-Medical):   Physical Activity:   . Days  of Exercise per Week:   . Minutes of Exercise per Session:   Stress:   . Feeling of Stress :   Social Connections:   . Frequency of Communication with Friends and Family:   . Frequency of Social Gatherings with Friends and Family:   . Attends Religious Services:   . Active Member of Clubs or Organizations:   . Attends Banker Meetings:   Marland Kitchen Marital Status:    Review of Systems - See HPI.  All other ROS are negative.  BP 102/60   Pulse 80   Temp 97.7 F (36.5 C)   Ht 5\' 5"  (1.651 m)   Wt 119 lb 4 oz (54.1 kg)   SpO2 99%   BMI 19.84 kg/m   Physical Exam Vitals reviewed.  Constitutional:      Appearance: Normal appearance.  HENT:     Head: Normocephalic and atraumatic.     Right Ear: Tympanic membrane normal.     Left Ear: Tympanic membrane normal.  Eyes:     Conjunctiva/sclera: Conjunctivae normal.  Cardiovascular:      Rate and Rhythm: Normal rate and regular rhythm.     Heart sounds: Normal heart sounds.  Pulmonary:     Effort: Pulmonary effort is normal.  Musculoskeletal:     Cervical back: Neck supple.  Skin:      Neurological:     General: No focal deficit present.     Mental Status: She is alert and oriented to person, place, and time.     Recent Results (from the past 2160 hour(s))  Basic metabolic panel     Status: Abnormal   Collection Time: 07/08/19  1:23 AM  Result Value Ref Range   Sodium 137 135 - 145 mmol/L   Potassium 3.5 3.5 - 5.1 mmol/L   Chloride 107 98 - 111 mmol/L   CO2 20 (L) 22 - 32 mmol/L   Glucose, Bld 141 (H) 70 - 99 mg/dL    Comment: Glucose reference range applies only to samples taken after fasting for at least 8 hours.   BUN 11 6 - 20 mg/dL   Creatinine, Ser 09/07/19 0.44 - 1.00 mg/dL   Calcium 9.4 8.9 - 0.08 mg/dL   GFR calc non Af Amer >60 >60 mL/min   GFR calc Af Amer >60 >60 mL/min   Anion gap 10 5 - 15    Comment: Performed at [facility], [address] [place], [place]  CBC     Status: Abnormal   Collection Time: 07/08/19  1:23 AM  Result Value Ref Range   WBC 7.0 4.0 - 10.5 K/uL   RBC 4.22 3.87 - 5.11 MIL/uL   Hemoglobin 12.9 12.0 - 15.0 g/dL   HCT 09/07/19 36 - 46 %   MCV 87.0 80.0 - 100.0 fL   MCH 30.6 26.0 - 34.0 pg   MCHC 35.1 30.0 - 36.0 g/dL   RDW 32.6 71.2 - 45.8 %   Platelets 133 (L) 150 - 400 K/uL   nRBC 0.0 0.0 - 0.2 %    Comment: Performed at [facility], [address] [place], [place]  Troponin I (High Sensitivity)     Status: None   Collection Time: 07/08/19  1:23 AM  Result Value Ref Range   Troponin I (High Sensitivity) <2 <18 ng/L    Comment: (NOTE) Elevated high sensitivity troponin I (hsTnI) values and significant  changes across serial measurements may suggest ACS but  many other  chronic and acute conditions are known to elevate hsTnI results.  Refer to the "Links" section for chest pain  algorithms and additional  guidance. Performed at [facility], [place] [address], [place], [place] [address]   I-Stat beta hCG blood, ED     Status: None   Collection Time: 07/08/19  1:30 AM  Result Value Ref Range   I-stat hCG, quantitative <5.0 <5 mIU/mL   Comment 3            Comment:   GEST. AGE      CONC.  (mIU/mL)   <=1 WEEK        5 - 50     2 WEEKS       50 - 500     3 WEEKS       100 - 10,000     4 WEEKS     1,000 - 30,000        FEMALE AND NON-PREGNANT FEMALE:     LESS THAN 5 mIU/mL   D-dimer, quantitative (not at [facility])     Status: None   Collection Time: 07/08/19  2:32 AM  Result Value Ref Range   D-Dimer, Quant 0.39 0.00 - 0.50 ug/mL-FEU    Comment: (NOTE) At the manufacturer cut-off of 0.50 ug/mL FEU, this assay has been documented to exclude PE with a sensitivity and negative predictive value of 97 to 99%.  At this time, this assay has not been approved by the FDA to exclude DVT/VTE. Results should be correlated with clinical presentation. Performed at [facility], [place] [address], [place], [place] [address]   Troponin I (High Sensitivity)     Status: None   Collection Time: 07/08/19  3:21 AM  Result Value Ref Range   Troponin I (High Sensitivity) <2 <18 ng/L    Comment: (NOTE) Elevated high sensitivity troponin I (hsTnI) values and significant  changes across serial measurements may suggest ACS but many other  chronic and acute conditions are known to elevate hsTnI results.  Refer to the "Links" section for chest pain algorithms and additional  guidance. Performed at [facility], [place] [address], [place], [place] [address]   Vitamin B12     Status: None   Collection Time: 07/09/19  2:58 PM  Result Value Ref Range   Vitamin B-12 584 211 - 911 pg/mL   Assessment/Plan: 1. Irritant contact dermatitis, unspecified trigger Developed after cleaning in a basement, wearing longer rubber gloves.  Initially thought she may have had a insect bite  to the area but the rash of breath.  History and physical examination are consistent with irritant contact dermatitis.  Cleaning of the skin reviewed with patient.  Can utilize OTC witch hazel or Sarna lotion.  Cool compresses may be beneficial.  Rx Kenalog cream 0.1% to apply twice daily.  Discussed nighttime occlusive dressing.  Follow-up if not resolving, anything worsens or new symptoms develop.  Patient voiced understanding and agreement with plan.  Patient was provided a written copy of her instructions.   This visit occurred during the SARS-CoV-2 public health emergency.  Safety protocols were in place, including screening questions prior to the visit, additional usage of staff PPE, and extensive cleaning of exam room while observing appropriate contact time as indicated for disinfecting solutions.     Leeanne Rio, PA-C

## 2019-08-27 ENCOUNTER — Encounter: Payer: Self-pay | Admitting: Physician Assistant

## 2019-12-23 ENCOUNTER — Ambulatory Visit (INDEPENDENT_AMBULATORY_CARE_PROVIDER_SITE_OTHER): Payer: Managed Care, Other (non HMO)

## 2019-12-23 ENCOUNTER — Other Ambulatory Visit: Payer: Self-pay

## 2019-12-23 DIAGNOSIS — Z23 Encounter for immunization: Secondary | ICD-10-CM

## 2020-06-30 ENCOUNTER — Other Ambulatory Visit: Payer: Self-pay | Admitting: Sports Medicine

## 2020-06-30 ENCOUNTER — Ambulatory Visit
Admission: RE | Admit: 2020-06-30 | Discharge: 2020-06-30 | Disposition: A | Discharge status: Home / Self Care | Payer: Managed Care, Other (non HMO) | Source: Ambulatory Visit | Attending: Sports Medicine | Admitting: Sports Medicine

## 2020-06-30 DIAGNOSIS — M25561 Pain in right knee: Secondary | ICD-10-CM

## 2020-06-30 DIAGNOSIS — S8001XA Contusion of right knee, initial encounter: Secondary | ICD-10-CM

## 2020-08-26 ENCOUNTER — Other Ambulatory Visit: Payer: Self-pay | Admitting: Obstetrics and Gynecology

## 2020-08-26 DIAGNOSIS — Z13820 Encounter for screening for osteoporosis: Secondary | ICD-10-CM

## 2020-09-01 ENCOUNTER — Other Ambulatory Visit: Payer: Self-pay | Admitting: Sports Medicine

## 2020-09-01 DIAGNOSIS — M25561 Pain in right knee: Secondary | ICD-10-CM

## 2020-09-01 DIAGNOSIS — M25461 Effusion, right knee: Secondary | ICD-10-CM

## 2020-09-02 ENCOUNTER — Ambulatory Visit
Admission: RE | Admit: 2020-09-02 | Discharge: 2020-09-02 | Disposition: A | Discharge status: Home / Self Care | Payer: Managed Care, Other (non HMO) | Source: Ambulatory Visit | Attending: Sports Medicine | Admitting: Sports Medicine

## 2020-09-02 DIAGNOSIS — M25561 Pain in right knee: Secondary | ICD-10-CM

## 2020-09-02 DIAGNOSIS — M25461 Effusion, right knee: Secondary | ICD-10-CM

## 2020-09-21 ENCOUNTER — Other Ambulatory Visit: Payer: Self-pay

## 2020-09-21 ENCOUNTER — Ambulatory Visit
Admission: RE | Admit: 2020-09-21 | Discharge: 2020-09-21 | Disposition: A | Discharge status: Home / Self Care | Payer: Managed Care, Other (non HMO) | Source: Ambulatory Visit | Attending: Obstetrics and Gynecology | Admitting: Obstetrics and Gynecology

## 2020-09-21 DIAGNOSIS — Z13820 Encounter for screening for osteoporosis: Secondary | ICD-10-CM
